# Patient Record
Sex: Female | Born: 1965 | Race: Black or African American | Hispanic: No | Marital: Married | Smoking: Never smoker
Health system: Southern US, Community
[De-identification: ages and names within clinical notes are randomized; demographics above are authoritative.]

## PROBLEM LIST (undated history)

## (undated) DIAGNOSIS — G43909 Migraine, unspecified, not intractable, without status migrainosus: Secondary | ICD-10-CM

## (undated) DIAGNOSIS — R569 Unspecified convulsions: Secondary | ICD-10-CM

## (undated) DIAGNOSIS — J45909 Unspecified asthma, uncomplicated: Secondary | ICD-10-CM

## (undated) DIAGNOSIS — I10 Essential (primary) hypertension: Secondary | ICD-10-CM

## (undated) DIAGNOSIS — L509 Urticaria, unspecified: Secondary | ICD-10-CM

## (undated) DIAGNOSIS — I209 Angina pectoris, unspecified: Secondary | ICD-10-CM

## (undated) HISTORY — PX: HERNIA REPAIR: SHX51

## (undated) HISTORY — DX: Urticaria, unspecified: L50.9

## (undated) HISTORY — PX: TUBAL LIGATION: SHX77

## (undated) HISTORY — DX: Unspecified asthma, uncomplicated: J45.909

---

## 2008-09-30 ENCOUNTER — Emergency Department (HOSPITAL_COMMUNITY): Admission: EM | Admit: 2008-09-30 | Discharge: 2008-09-30 | Payer: Self-pay | Admitting: Emergency Medicine

## 2008-10-19 ENCOUNTER — Ambulatory Visit: Payer: Self-pay | Admitting: Cardiology

## 2008-10-19 ENCOUNTER — Observation Stay (HOSPITAL_COMMUNITY): Admission: EM | Admit: 2008-10-19 | Discharge: 2008-10-20 | Payer: Self-pay | Admitting: Emergency Medicine

## 2008-10-20 ENCOUNTER — Encounter (INDEPENDENT_AMBULATORY_CARE_PROVIDER_SITE_OTHER): Payer: Self-pay | Admitting: Emergency Medicine

## 2010-05-12 LAB — URINALYSIS, ROUTINE W REFLEX MICROSCOPIC
Nitrite: NEGATIVE
Protein, ur: NEGATIVE mg/dL
Specific Gravity, Urine: 1.016 (ref 1.005–1.030)
Urobilinogen, UA: 0.2 mg/dL (ref 0.0–1.0)

## 2010-05-12 LAB — CBC
Hemoglobin: 11.7 g/dL — ABNORMAL LOW (ref 12.0–15.0)
MCHC: 32.9 g/dL (ref 30.0–36.0)
Platelets: 285 10*3/uL (ref 150–400)
RDW: 15.2 % (ref 11.5–15.5)

## 2010-05-12 LAB — PROTIME-INR: INR: 1 (ref 0.00–1.49)

## 2010-05-12 LAB — BASIC METABOLIC PANEL
BUN: 10 mg/dL (ref 6–23)
CO2: 28 mEq/L (ref 19–32)
Calcium: 9 mg/dL (ref 8.4–10.5)
GFR calc non Af Amer: >60 mL/min (ref >60)
Glucose, Bld: 97 mg/dL (ref 70–99)

## 2010-05-12 LAB — URINE MICROSCOPIC-ADD ON

## 2010-05-12 LAB — DIFFERENTIAL
Basophils Absolute: 0 10*3/uL (ref 0.0–0.1)
Basophils Relative: 0 % (ref 0–1)
Eosinophils Relative: 3 % (ref 0–5)
Lymphocytes Relative: 25 % (ref 12–46)
Monocytes Absolute: 0.9 10*3/uL (ref 0.1–1.0)
Neutro Abs: 7.6 10*3/uL (ref 1.7–7.7)

## 2010-05-12 LAB — POCT I-STAT, CHEM 8
HCT: 37 % (ref 36.0–46.0)
Hemoglobin: 12.6 g/dL (ref 12.0–15.0)
Potassium: 3.3 mEq/L — ABNORMAL LOW (ref 3.5–5.1)
Sodium: 136 mEq/L (ref 135–145)
TCO2: 24 mmol/L (ref 0–100)

## 2010-05-12 LAB — D-DIMER, QUANTITATIVE: D-Dimer, Quant: 0.24 ug/mL-FEU (ref 0.00–0.48)

## 2010-05-12 LAB — POCT CARDIAC MARKERS
CKMB, poc: 1 ng/mL (ref 1.0–8.0)
CKMB, poc: <1 ng/mL — ABNORMAL LOW (ref 1.0–8.0)
CKMB, poc: <1 ng/mL — ABNORMAL LOW (ref 1.0–8.0)
Myoglobin, poc: 56.8 ng/mL (ref 12–200)
Myoglobin, poc: 61.3 ng/mL (ref 12–200)
Myoglobin, poc: 74.2 ng/mL (ref 12–200)
Troponin i, poc: <0.05 ng/mL (ref 0.00–0.09)

## 2010-05-12 LAB — APTT: aPTT: 30 seconds (ref 24–37)

## 2010-05-13 LAB — CBC
MCV: 86.3 fL (ref 78.0–100.0)
Platelets: 316 10*3/uL (ref 150–400)
RBC: 4.29 MIL/uL (ref 3.87–5.11)
WBC: 11.5 10*3/uL — ABNORMAL HIGH (ref 4.0–10.5)

## 2010-05-13 LAB — COMPREHENSIVE METABOLIC PANEL
ALT: 15 U/L (ref 0–35)
AST: 20 U/L (ref 0–37)
Albumin: 3.6 g/dL (ref 3.5–5.2)
Chloride: 102 mEq/L (ref 96–112)
Creatinine, Ser: 0.76 mg/dL (ref 0.4–1.2)
GFR calc Af Amer: >60 mL/min (ref >60)
Potassium: 3.7 mEq/L (ref 3.5–5.1)
Sodium: 138 mEq/L (ref 135–145)
Total Bilirubin: 0.3 mg/dL (ref 0.3–1.2)

## 2010-05-13 LAB — POCT CARDIAC MARKERS
CKMB, poc: <1 ng/mL — ABNORMAL LOW (ref 1.0–8.0)
CKMB, poc: <1 ng/mL — ABNORMAL LOW (ref 1.0–8.0)
Myoglobin, poc: 43 ng/mL (ref 12–200)
Troponin i, poc: <0.05 ng/mL (ref 0.00–0.09)

## 2010-05-13 LAB — DIFFERENTIAL
Basophils Absolute: 0 10*3/uL (ref 0.0–0.1)
Eosinophils Absolute: 0.1 10*3/uL (ref 0.0–0.7)
Eosinophils Relative: 1 % (ref 0–5)
Lymphocytes Relative: 18 % (ref 12–46)
Monocytes Absolute: 0.2 10*3/uL (ref 0.1–1.0)

## 2013-09-19 ENCOUNTER — Emergency Department (HOSPITAL_COMMUNITY): Payer: Self-pay

## 2013-09-19 ENCOUNTER — Emergency Department (HOSPITAL_COMMUNITY)
Admission: EM | Admit: 2013-09-19 | Discharge: 2013-09-19 | Disposition: A | Discharge status: Home / Self Care | Payer: Self-pay | Attending: Emergency Medicine | Admitting: Emergency Medicine

## 2013-09-19 ENCOUNTER — Encounter (HOSPITAL_COMMUNITY): Payer: Self-pay | Admitting: Emergency Medicine

## 2013-09-19 DIAGNOSIS — G43009 Migraine without aura, not intractable, without status migrainosus: Secondary | ICD-10-CM

## 2013-09-19 DIAGNOSIS — G43109 Migraine with aura, not intractable, without status migrainosus: Secondary | ICD-10-CM | POA: Insufficient documentation

## 2013-09-19 DIAGNOSIS — I10 Essential (primary) hypertension: Secondary | ICD-10-CM | POA: Insufficient documentation

## 2013-09-19 DIAGNOSIS — Z79899 Other long term (current) drug therapy: Secondary | ICD-10-CM | POA: Insufficient documentation

## 2013-09-19 DIAGNOSIS — R55 Syncope and collapse: Secondary | ICD-10-CM | POA: Insufficient documentation

## 2013-09-19 HISTORY — DX: Essential (primary) hypertension: I10

## 2013-09-19 HISTORY — DX: Angina pectoris, unspecified: I20.9

## 2013-09-19 LAB — CBC WITH DIFFERENTIAL/PLATELET
Basophils Absolute: 0 10*3/uL (ref 0.0–0.1)
Basophils Relative: 0 % (ref 0–1)
Eosinophils Absolute: 0.2 10*3/uL (ref 0.0–0.7)
Eosinophils Relative: 1 % (ref 0–5)
HCT: 34 % — ABNORMAL LOW (ref 36.0–46.0)
Hemoglobin: 10.7 g/dL — ABNORMAL LOW (ref 12.0–15.0)
LYMPHS PCT: 25 % (ref 12–46)
Lymphs Abs: 3.2 10*3/uL (ref 0.7–4.0)
MCH: 25.2 pg — ABNORMAL LOW (ref 26.0–34.0)
MCHC: 31.5 g/dL (ref 30.0–36.0)
MCV: 80.2 fL (ref 78.0–100.0)
Monocytes Absolute: 1 10*3/uL (ref 0.1–1.0)
Monocytes Relative: 8 % (ref 3–12)
Neutro Abs: 8.3 10*3/uL — ABNORMAL HIGH (ref 1.7–7.7)
Neutrophils Relative %: 66 % (ref 43–77)
PLATELETS: 334 10*3/uL (ref 150–400)
RBC: 4.24 MIL/uL (ref 3.87–5.11)
RDW: 16.1 % — ABNORMAL HIGH (ref 11.5–15.5)
WBC: 12.7 10*3/uL — AB (ref 4.0–10.5)

## 2013-09-19 LAB — BASIC METABOLIC PANEL
Anion gap: 9 (ref 5–15)
BUN: 13 mg/dL (ref 6–23)
CO2: 27 mEq/L (ref 19–32)
Calcium: 9.1 mg/dL (ref 8.4–10.5)
Chloride: 99 mEq/L (ref 96–112)
Creatinine, Ser: 0.66 mg/dL (ref 0.50–1.10)
GFR calc Af Amer: >90 mL/min (ref >90)
GFR calc non Af Amer: >90 mL/min (ref >90)
Glucose, Bld: 84 mg/dL (ref 70–99)
Potassium: 3.6 mEq/L — ABNORMAL LOW (ref 3.7–5.3)
SODIUM: 135 meq/L — AB (ref 137–147)

## 2013-09-19 LAB — HCG, SERUM, QUALITATIVE: PREG SERUM: NEGATIVE

## 2013-09-19 MED ORDER — ACETAMINOPHEN 500 MG PO TABS
1000.0000 mg | ORAL_TABLET | Freq: Once | ORAL | Status: AC
  Administered 2013-09-19: 1000 mg via ORAL
  Filled 2013-09-19: qty 2

## 2013-09-19 MED ORDER — MORPHINE SULFATE 4 MG/ML IJ SOLN
4.0000 mg | INTRAMUSCULAR | Status: DC | PRN
  Filled 2013-09-19: qty 1

## 2013-09-19 MED ORDER — PROCHLORPERAZINE EDISYLATE 5 MG/ML IJ SOLN
10.0000 mg | Freq: Once | INTRAMUSCULAR | Status: DC
  Filled 2013-09-19: qty 2

## 2013-09-19 MED ORDER — SODIUM CHLORIDE 0.9 % IV BOLUS (SEPSIS)
1000.0000 mL | Freq: Once | INTRAVENOUS | Status: AC
  Administered 2013-09-19: 1000 mL via INTRAVENOUS

## 2013-09-19 NOTE — ED Notes (Signed)
Pt states she had a bad headache, felt very dizzy, the she "fell", thinks she may have passed out.  States she started a new medication for her high blood pressure today.  Lisinopril/hctz 20/12.5 and amlodapine 10 mg. She states she took both at the same time around 1pm.

## 2013-09-19 NOTE — Discharge Instructions (Signed)

## 2013-09-19 NOTE — ED Provider Notes (Signed)
CSN: [id number]     Arrival date & time 09/19/13  1926 History  This chart was scribed for Dorie Rank, MD by Jeanell Sparrow, ED Scribe. This patient was seen in room APA11/APA11 and the patient's care was started at 8:02 PM.   Chief Complaint  Patient presents with  . Near Syncope   The history is provided by the patient. No language interpreter was used.   HPI Comments: Kristina Bonilla is a 48 y.o. female with a hx of migraines who presents to the Emergency Department complaining of an episode of syncope that occurred today. Pt states that she had a headache and felt dizzy when she fell in her bathroom and lost consciousness. She states that she started a new blood pressure medication today that she took about 7 hours ago. She reports that her dizziness and HA has been present for about 4 days. She states that her headache is right sided and goes across the front and back of her head. She reports that her headache is similar to prior migraines. She states that her dizziness and headache were exacerbated when taking the new medications, which are Lisinopril and Amlodapine. She reports no prior hx of syncopal episodes. She denies any hx of cardiac stents, heart catheterization, or stress test. She also denies any difficulty with speech, nausea, emesis, or diarrhea.   Past Medical History  Diagnosis Date  . Hypertension   . Angina pectoris    History reviewed. No pertinent past surgical history. History reviewed. No pertinent family history. History  Substance Use Topics  . Smoking status: Never Smoker   . Smokeless tobacco: Never Used  . Alcohol Use: Not on file   OB History   Grav Para Term Preterm Abortions TAB SAB Ect Mult Living                 Review of Systems  Constitutional: Negative for fever.  Cardiovascular: Positive for near-syncope.  Gastrointestinal: Negative for nausea, vomiting, diarrhea and blood in stool.  Neurological: Positive for dizziness, syncope and headaches.  Negative for speech difficulty.  All other systems reviewed and are negative.   Allergies  Review of patient's allergies indicates no known allergies.  Home Medications   Prior to Admission medications   Medication Sig Start Date End Date Taking? Authorizing Provider  amLODipine (NORVASC) 10 MG tablet Take 10 mg by mouth daily.   Yes Historical Provider, MD  lisinopril-hydrochlorothiazide (PRINZIDE,ZESTORETIC) 20-12.5 MG per tablet Take 1 tablet by mouth daily.   Yes Historical Provider, MD   BP 126/75  Pulse 75  Temp(Src) 98.4 F (36.9 C) (Oral)  Resp 20  Ht 5\' 2"  (1.575 m)  Wt 229 lb (103.874 kg)  BMI 41.87 kg/m2  SpO2 100%  LMP 08/26/2013 Physical Exam  Nursing note and vitals reviewed. Constitutional: She appears well-developed and well-nourished. No distress.  HENT:  Head: Normocephalic and atraumatic.  Right Ear: External ear normal.  Left Ear: External ear normal.  Eyes: Conjunctivae are normal. Right eye exhibits no discharge. Left eye exhibits no discharge. No scleral icterus.  Neck: Neck supple. No tracheal deviation present.  Cardiovascular: Normal rate, regular rhythm and intact distal pulses.   Pulmonary/Chest: Effort normal and breath sounds normal. No stridor. No respiratory distress. She has no wheezes. She has no rales.  Abdominal: Soft. Bowel sounds are normal. She exhibits no distension. There is no tenderness. There is no rebound and no guarding.  Musculoskeletal: She exhibits no edema and no tenderness.  Neurological: She  is alert. She has normal strength. No cranial nerve deficit (no facial droop, extraocular movements intact, no slurred speech) or sensory deficit. She exhibits normal muscle tone. She displays no seizure activity. Coordination normal.  Skin: Skin is warm and dry. No rash noted.  Psychiatric: She has a normal mood and affect.    ED Course  Procedures (including critical care time) DIAGNOSTIC STUDIES: Oxygen Saturation is 100% on RA,  normal by my interpretation.    COORDINATION OF CARE: 8:06 PM- Pt advised of plan for treatment which includes medication, labs, and an EKG and pt agrees.  Labs Review Labs Reviewed  CBC WITH DIFFERENTIAL - Abnormal; Notable for the following:    WBC 12.7 (*)    Hemoglobin 10.7 (*)    HCT 34.0 (*)    MCH 25.2 (*)    RDW 16.1 (*)    Neutro Abs 8.3 (*)    All other components within normal limits  BASIC METABOLIC PANEL - Abnormal; Notable for the following:    Sodium 135 (*)    Potassium 3.6 (*)    All other components within normal limits  HCG, SERUM, QUALITATIVE    Imaging Review Ct Head Wo Contrast  09/19/2013   CLINICAL DATA:  Headache.  Hypertension.  EXAM: CT HEAD WITHOUT CONTRAST  TECHNIQUE: Contiguous axial images were obtained from the base of the skull through the vertex without intravenous contrast.  COMPARISON:  None.  FINDINGS: There is no evidence of acute intracranial abnormality including infarct, hemorrhage, mass lesion, mass effect, midline shift or abnormal extra-axial fluid collection. There is no hydrocephalus or pneumocephalus. The calvarium is intact. Imaged paranasal sinuses and mastoid air cells are clear.  IMPRESSION: Negative head CT.   Electronically Signed   By: Inge Rise M.D.   On: 09/19/2013 21:20     EKG Interpretation   Date/Time:  Saturday September 19 2013 19:55:02 EDT Ventricular Rate:  80 PR Interval:  161 QRS Duration: 88 QT Interval:  373 QTC Calculation: 430 R Axis:   46 Text Interpretation:  Sinus rhythm Normal ECG Confirmed by POLLINA  MD,  CHRISTOPHER [phone]) on 09/19/2013 8:48:14 PM      MDM   Final diagnoses:  Vasovagal syncope  Migraine without aura and without status migrainosus, not intractable   The patient's headache suspect is related to a migraine headache. She does have a history of this. Did not want any medications in the emergency department other than Tylenol. The symptoms have subsequently resolved. I doubt  more serious etiology such as hemorrhage, meningitis, stroke.  The patient's fainting spell maybe vasovagal in nature. Her EKG is unremarkable. I doubt cardiac dysrhythmia. Patient has a mild anemia but this not significantly changed from her baseline. She does have history of heavy menstrual periods.   I personally performed the services described in this documentation, which was scribed in my presence.  The recorded information has been reviewed and is accurate.     Dorie Rank, MD 09/19/13 [phone]

## 2014-03-09 ENCOUNTER — Telehealth: Payer: Self-pay | Admitting: *Deleted

## 2014-03-09 NOTE — Telephone Encounter (Deleted)
error 

## 2014-03-09 NOTE — Telephone Encounter (Signed)
error 

## 2014-03-24 ENCOUNTER — Emergency Department (HOSPITAL_COMMUNITY)
Admission: EM | Admit: 2014-03-24 | Discharge: 2014-03-25 | Disposition: A | Discharge status: Home / Self Care | Payer: BLUE CROSS/BLUE SHIELD | Attending: Emergency Medicine | Admitting: Emergency Medicine

## 2014-03-24 ENCOUNTER — Encounter (HOSPITAL_COMMUNITY): Payer: Self-pay | Admitting: Emergency Medicine

## 2014-03-24 DIAGNOSIS — R519 Headache, unspecified: Secondary | ICD-10-CM

## 2014-03-24 DIAGNOSIS — Z79899 Other long term (current) drug therapy: Secondary | ICD-10-CM | POA: Diagnosis not present

## 2014-03-24 DIAGNOSIS — I209 Angina pectoris, unspecified: Secondary | ICD-10-CM | POA: Diagnosis not present

## 2014-03-24 DIAGNOSIS — R51 Headache: Secondary | ICD-10-CM | POA: Insufficient documentation

## 2014-03-24 DIAGNOSIS — I10 Essential (primary) hypertension: Secondary | ICD-10-CM | POA: Insufficient documentation

## 2014-03-24 LAB — CBC WITH DIFFERENTIAL/PLATELET
BASOS ABS: 0 10*3/uL (ref 0.0–0.1)
BASOS PCT: 0 % (ref 0–1)
EOS PCT: 1 % (ref 0–5)
Eosinophils Absolute: 0.2 10*3/uL (ref 0.0–0.7)
HCT: 32.7 % — ABNORMAL LOW (ref 36.0–46.0)
Hemoglobin: 10.2 g/dL — ABNORMAL LOW (ref 12.0–15.0)
Lymphocytes Relative: 22 % (ref 12–46)
Lymphs Abs: 3.2 10*3/uL (ref 0.7–4.0)
MCH: 25.4 pg — ABNORMAL LOW (ref 26.0–34.0)
MCHC: 31.2 g/dL (ref 30.0–36.0)
MCV: 81.5 fL (ref 78.0–100.0)
MONO ABS: 1.3 10*3/uL — AB (ref 0.1–1.0)
Monocytes Relative: 9 % (ref 3–12)
NEUTROS ABS: 10.1 10*3/uL — AB (ref 1.7–7.7)
Neutrophils Relative %: 68 % (ref 43–77)
PLATELETS: 333 10*3/uL (ref 150–400)
RBC: 4.01 MIL/uL (ref 3.87–5.11)
RDW: 15.6 % — AB (ref 11.5–15.5)
WBC: 14.7 10*3/uL — AB (ref 4.0–10.5)

## 2014-03-24 LAB — COMPREHENSIVE METABOLIC PANEL
ALBUMIN: 3.4 g/dL — AB (ref 3.5–5.2)
ALK PHOS: 98 U/L (ref 39–117)
ALT: 17 U/L (ref 0–35)
AST: 18 U/L (ref 0–37)
Anion gap: 3 — ABNORMAL LOW (ref 5–15)
BUN: 13 mg/dL (ref 6–23)
CO2: 27 mmol/L (ref 19–32)
Calcium: 8.6 mg/dL (ref 8.4–10.5)
Chloride: 107 mmol/L (ref 96–112)
Creatinine, Ser: 0.68 mg/dL (ref 0.50–1.10)
GFR calc Af Amer: >90 mL/min (ref >90)
GFR calc non Af Amer: >90 mL/min (ref >90)
Glucose, Bld: 107 mg/dL — ABNORMAL HIGH (ref 70–99)
POTASSIUM: 3.6 mmol/L (ref 3.5–5.1)
SODIUM: 137 mmol/L (ref 135–145)
Total Bilirubin: 0.2 mg/dL — ABNORMAL LOW (ref 0.3–1.2)
Total Protein: 7.5 g/dL (ref 6.0–8.3)

## 2014-03-24 NOTE — ED Notes (Signed)
Pt reports her BP has been elevated and she wakes up with a headache.

## 2014-03-25 MED ORDER — LORAZEPAM 2 MG/ML IJ SOLN
0.5000 mg | Freq: Once | INTRAMUSCULAR | Status: AC
  Administered 2014-03-25: 0.5 mg via INTRAMUSCULAR
  Filled 2014-03-25: qty 1

## 2014-03-25 MED ORDER — SODIUM CHLORIDE 0.9 % IV BOLUS (SEPSIS)
1000.0000 mL | Freq: Once | INTRAVENOUS | Status: AC
  Administered 2014-03-25: 1000 mL via INTRAVENOUS

## 2014-03-25 MED ORDER — METOCLOPRAMIDE HCL 5 MG/ML IJ SOLN
10.0000 mg | Freq: Once | INTRAMUSCULAR | Status: AC
  Administered 2014-03-25: 10 mg via INTRAVENOUS
  Filled 2014-03-25: qty 2

## 2014-03-25 MED ORDER — KETOROLAC TROMETHAMINE 30 MG/ML IJ SOLN
30.0000 mg | Freq: Once | INTRAMUSCULAR | Status: AC
  Administered 2014-03-25: 30 mg via INTRAVENOUS
  Filled 2014-03-25: qty 1

## 2014-03-25 MED ORDER — DIPHENHYDRAMINE HCL 50 MG/ML IJ SOLN
25.0000 mg | Freq: Once | INTRAMUSCULAR | Status: AC
  Administered 2014-03-25: 25 mg via INTRAVENOUS
  Filled 2014-03-25: qty 1

## 2014-03-25 NOTE — ED Provider Notes (Signed)
CSN: [id number]     Arrival date & time 03/24/14  2221 History   First MD Initiated Contact with Patient 03/24/14 2347     Chief Complaint  Patient presents with  . Hypertension  . Headache     (Consider location/radiation/quality/duration/timing/severity/associated sxs/prior Treatment) HPI  Patient reports for the past 4 days she has had a headache that she indicates is behind her ears bilaterally. She describes it as a thumping and nagging pain. She takes Excedrin and it will go away for several hours however it returns. She's taking Excedrin about 6 times a day. She denies any ringing of her ears. She has had nausea without vomiting, she states she has photosensitivity and noise sensitivity. She states she also noticed the headache gets worse when she doesn't get enough rest. She states she's had this headache before. She states she gets headaches 3-4 times a month. She even had her glasses changed however the headaches have continued. She states there is a strong family history of headaches. She states she has been a unable to sleep for the past 4 days because of her headache hurting. 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 She checked her blood pressure and it was 151/117 with a heart rate of 102. She states her current pain is 8 out of 10 which is the worse it has been.  PCP [facility] Department   Past Medical History  Diagnosis Date  . Hypertension   . Angina pectoris    History reviewed. No pertinent past surgical history. No family history on file. History  Substance Use Topics  . Smoking status: Never Smoker   . Smokeless tobacco: Never Used  . Alcohol Use: No   employed   OB History    No data available     Review of Systems  All other systems reviewed and are negative.     Allergies  Review of patient's allergies indicates no known allergies.  Home Medications   Prior to Admission medications   Medication Sig Start Date End Date  Taking? Authorizing Provider  amLODipine (NORVASC) 10 MG tablet Take 10 mg by mouth daily.    Historical Provider, MD  lisinopril-hydrochlorothiazide (PRINZIDE,ZESTORETIC) 20-12.5 MG per tablet Take 1 tablet by mouth daily. Not taking   Historical Provider, MD   Lasix for swelling   ED Triage Vitals  Enc Vitals Group     BP 03/24/14 2224 141/81 mmHg     Pulse Rate 03/24/14 2224 102     Resp 03/24/14 2224 20     Temp 03/24/14 2224 97.7 F (36.5 C)     Temp Source 03/24/14 2224 Oral     SpO2 03/24/14 2224 98 %     Weight 03/24/14 2224 248 lb (112.492 kg)     Height 03/24/14 2224 5\' 1"  (1.549 m)     Head Cir --      Peak Flow --      Pain Score 03/24/14 2225 9     Pain Loc --      Pain Edu? --      Excl. in [place]? --    Vital signs normal except for tachycardia   Physical Exam  Constitutional: She is oriented to person, place, and time. She appears well-developed and well-nourished.  Non-toxic appearance. She does not appear ill. No distress.  HENT:  Head: Normocephalic and atraumatic.    Right Ear: External ear normal.  Left Ear: External ear normal.  Nose: Nose normal.  No mucosal edema or rhinorrhea.  Mouth/Throat: Oropharynx is clear and moist and mucous membranes are normal. No dental abscesses or uvula swelling.  Area of headache noted, no swelling over the mastoid area, no redness  Eyes: Conjunctivae and EOM are normal. Pupils are equal, round, and reactive to light.  Neck: Normal range of motion and full passive range of motion without pain. Neck supple.  Cardiovascular: Normal rate, regular rhythm and normal heart sounds.  Exam reveals no gallop and no friction rub.   No murmur heard. Pulmonary/Chest: Effort normal and breath sounds normal. No respiratory distress. She has no wheezes. She has no rhonchi. She has no rales. She exhibits no tenderness and no crepitus.  Abdominal: Soft. Normal appearance and bowel sounds are normal. She exhibits no distension. There is no  tenderness. There is no rebound and no guarding.  Musculoskeletal: Normal range of motion. She exhibits no edema or tenderness.  Moves all extremities well.   Neurological: She is alert and oriented to person, place, and time. She has normal strength. No cranial nerve deficit.  Skin: Skin is warm, dry and intact. No rash noted. No erythema. No pallor.  Psychiatric: She has a normal mood and affect. Her speech is normal and behavior is normal. Her mood appears not anxious.  Nursing note and vitals reviewed.   ED Course  Procedures (including critical care time)  Medications  sodium chloride 0.9 % bolus 1,000 mL (0 mLs Intravenous Stopped 03/25/14 0124)  metoCLOPramide (REGLAN) injection 10 mg (10 mg Intravenous Given 03/25/14 0106)  diphenhydrAMINE (BENADRYL) injection 25 mg (25 mg Intravenous Given 03/25/14 0105)  ketorolac (TORADOL) 30 MG/ML injection 30 mg (30 mg Intravenous Given 03/25/14 0106)  LORazepam (ATIVAN) injection 0.5 mg (0.5 mg Intramuscular Given 03/25/14 0142)   Nurse reports patient took out her IV was complained of feeling jittery. She was given Ativan 1/2 mg IM. At time of discharge she states her headache is gone.   Labs Review Results for orders placed or performed during the hospital encounter of 03/24/14  CBC with Differential  Result Value Ref Range   WBC 14.7 (H) 4.0 - 10.5 K/uL   RBC 4.01 3.87 - 5.11 MIL/uL   Hemoglobin 10.2 (L) 12.0 - 15.0 g/dL   HCT 32.7 (L) 36.0 - 46.0 %   MCV 81.5 78.0 - 100.0 fL   MCH 25.4 (L) 26.0 - 34.0 pg   MCHC 31.2 30.0 - 36.0 g/dL   RDW 15.6 (H) 11.5 - 15.5 %   Platelets 333 150 - 400 K/uL   Neutrophils Relative % 68 43 - 77 %   Neutro Abs 10.1 (H) 1.7 - 7.7 K/uL   Lymphocytes Relative 22 12 - 46 %   Lymphs Abs 3.2 0.7 - 4.0 K/uL   Monocytes Relative 9 3 - 12 %   Monocytes Absolute 1.3 (H) 0.1 - 1.0 K/uL   Eosinophils Relative 1 0 - 5 %   Eosinophils Absolute 0.2 0.0 - 0.7 K/uL   Basophils Relative 0 0 - 1 %   Basophils  Absolute 0.0 0.0 - 0.1 K/uL  Comprehensive metabolic panel  Result Value Ref Range   Sodium 137 135 - 145 mmol/L   Potassium 3.6 3.5 - 5.1 mmol/L   Chloride 107 96 - 112 mmol/L   CO2 27 19 - 32 mmol/L   Glucose, Bld 107 (H) 70 - 99 mg/dL   BUN 13 6 - 23 mg/dL   Creatinine, Ser 0.68 0.50 - 1.10 mg/dL  Calcium 8.6 8.4 - 10.5 mg/dL   Total Protein 7.5 6.0 - 8.3 g/dL   Albumin 3.4 (L) 3.5 - 5.2 g/dL   AST 18 0 - 37 U/L   ALT 17 0 - 35 U/L   Alkaline Phosphatase 98 39 - 117 U/L   Total Bilirubin 0.2 (L) 0.3 - 1.2 mg/dL   GFR calc non Af Amer >90 >90 mL/min   GFR calc Af Amer >90 >90 mL/min   Anion gap 3 (L) 5 - 15   Laboratory interpretation all normal except leukocytosis and anemia    Imaging Review No results found.   EKG Interpretation None      MDM   Final diagnoses:  Headache, unspecified headache type    Plan discharge  Rolland Porter, MD, Alanson Aly, MD 03/25/14 0230

## 2014-03-25 NOTE — Discharge Instructions (Signed)
Recheck as needed. Consider seeing a neurologist about your headaches.    General Headache Without Cause A headache is pain or discomfort felt around the head or neck area. The specific cause of a headache may not be found. There are many causes and types of headaches. A few common ones are:  Tension headaches.  Migraine headaches.  Cluster headaches.  Chronic daily headaches. HOME CARE INSTRUCTIONS   Keep all follow-up appointments with your caregiver or any specialist referral.  Only take over-the-counter or prescription medicines for pain or discomfort as directed by your caregiver.  Lie down in a dark, quiet room when you have a headache.  Keep a headache journal to find out what may trigger your migraine headaches. For example, write down:  What you eat and drink.  How much sleep you get.  Any change to your diet or medicines.  Try massage or other relaxation techniques.  Put ice packs or heat on the head and neck. Use these 3 to 4 times per day for 15 to 20 minutes each time, or as needed.  Limit stress.  Sit up straight, and do not tense your muscles.  Quit smoking if you smoke.  Limit alcohol use.  Decrease the amount of caffeine you drink, or stop drinking caffeine.  Eat and sleep on a regular schedule.  Get 7 to 9 hours of sleep, or as recommended by your caregiver.  Keep lights dim if bright lights bother you and make your headaches worse. SEEK MEDICAL CARE IF:   You have problems with the medicines you were prescribed.  Your medicines are not working.  You have a change from the usual headache.  You have nausea or vomiting. SEEK IMMEDIATE MEDICAL CARE IF:   Your headache becomes severe.  You have a fever.  You have a stiff neck.  You have loss of vision.  You have muscular weakness or loss of muscle control.  You start losing your balance or have trouble walking.  You feel faint or pass out.  You have severe symptoms that are  different from your first symptoms. MAKE SURE YOU:   Understand these instructions.  Will watch your condition.  Will get help right away if you are not doing well or get worse. Document Released: 01/22/2005 Document Revised: 04/16/2011 Document Reviewed: 02/07/2011 [facility] Patient Information 2015 [place], [place]. This information is not intended to replace advice given to you by your health care provider. Make sure you discuss any questions you have with your health care provider.

## 2014-03-25 NOTE — ED Notes (Signed)
Pt removed IV stating "I don't like the way it made me feel. I feel jittery and I can't sit still."

## 2014-11-23 ENCOUNTER — Other Ambulatory Visit (HOSPITAL_COMMUNITY): Payer: Self-pay | Admitting: *Deleted

## 2014-11-23 DIAGNOSIS — N644 Mastodynia: Secondary | ICD-10-CM

## 2014-11-23 DIAGNOSIS — N63 Unspecified lump in unspecified breast: Secondary | ICD-10-CM

## 2014-12-01 ENCOUNTER — Encounter: Payer: Self-pay | Admitting: Obstetrics & Gynecology

## 2014-12-01 ENCOUNTER — Ambulatory Visit (INDEPENDENT_AMBULATORY_CARE_PROVIDER_SITE_OTHER): Payer: 59 | Admitting: Obstetrics & Gynecology

## 2014-12-01 ENCOUNTER — Other Ambulatory Visit (HOSPITAL_COMMUNITY)
Admission: RE | Admit: 2014-12-01 | Discharge: 2014-12-01 | Disposition: A | Discharge status: Home / Self Care | Payer: 59 | Source: Ambulatory Visit | Attending: Obstetrics & Gynecology | Admitting: Obstetrics & Gynecology

## 2014-12-01 VITALS — BP 110/70 | HR 74 | Ht 62.0 in | Wt 244.0 lb

## 2014-12-01 DIAGNOSIS — R1032 Left lower quadrant pain: Secondary | ICD-10-CM

## 2014-12-01 DIAGNOSIS — Z1151 Encounter for screening for human papillomavirus (HPV): Secondary | ICD-10-CM | POA: Diagnosis not present

## 2014-12-01 DIAGNOSIS — Z01419 Encounter for gynecological examination (general) (routine) without abnormal findings: Secondary | ICD-10-CM

## 2014-12-01 NOTE — Progress Notes (Signed)
Patient ID: Kristina Bonilla, female   DOB: 12-29-1965, 49 y.o.   MRN: [id number] Subjective:     Kristina Bonilla is a 49 y.o. female here for a routine exam.  Patient's last menstrual period was 10/28/2014. No obstetric history on file. 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   Current acute medical issues:  hypertension   Recent Gynecologic History Patient's last menstrual period was 10/28/2014. Last Pap: years ago, 2005,  normal Last mammogram: 2005,  Normal, has one scheduled for 12/07/2014  Past Medical History  Diagnosis Date  . Hypertension   . Angina pectoris [facility])     Past Surgical History  Procedure Laterality Date  . Tubal ligation      OB History    No data available      Social History   Social History  . Marital Status: Married    Spouse Name: N/A  . Number of Children: N/A  . Years of Education: N/A   Social History Main Topics  . Smoking status: Never Smoker   . Smokeless tobacco: Never Used  . Alcohol Use: No  . Drug Use: None  . Sexual Activity: Not Asked   Other Topics Concern  . None   Social History Narrative    Family History  Problem Relation Age of Onset  . Heart disease Mother   . Hypertension Father   . Stroke Father   . Cancer Paternal Aunt   . Cancer Paternal Aunt      Current outpatient prescriptions:  .  amLODipine (NORVASC) 10 MG tablet, Take 10 mg by mouth daily., Disp: , Rfl:  .  metoprolol succinate (TOPROL-XL) 25 MG 24 hr tablet, Take 25 mg by mouth daily., Disp: , Rfl:  .  lisinopril-hydrochlorothiazide (PRINZIDE,ZESTORETIC) 20-12.5 MG per tablet, Take 1 tablet by mouth daily., Disp: , Rfl:   Review of Systems  Review of Systems  Constitutional: Negative for fever, chills, weight loss, malaise/fatigue and diaphoresis.   HENT: Negative for hearing loss, ear pain, nosebleeds, congestion, sore throat, neck pain, tinnitus and ear discharge.   Eyes: Negative for blurred vision, double vision, photophobia, pain, discharge and redness.  Respiratory: Negative for cough, hemoptysis, sputum production, shortness of breath, wheezing and stridor.   Cardiovascular: Negative for chest pain, palpitations, orthopnea, claudication, leg swelling and PND.  Gastrointestinal: negative for abdominal pain. Negative for heartburn, nausea, vomiting, diarrhea, constipation, blood in stool and melena.  Genitourinary: Negative for dysuria, urgency, frequency, hematuria and flank pain.  Musculoskeletal: Negative for myalgias, back pain, joint pain and falls.  Skin: Negative for itching and rash.  Neurological: Negative for dizziness, tingling, tremors, sensory change, speech change, focal weakness, seizures, loss of consciousness, weakness and headaches.  Endo/Heme/Allergies: Negative for environmental allergies and polydipsia. Does not bruise/bleed easily.  Psychiatric/Behavioral: Negative for depression, suicidal ideas, hallucinations, memory loss and substance abuse. The patient is not nervous/anxious and does not have insomnia.        Objective:  Blood pressure 110/70, pulse 74, height 5\' 2"  (1.575 m), weight 244 lb (110.678 kg), last menstrual period 10/28/2014.   Physical Exam  Vitals reviewed. Constitutional: She is oriented to person, place, and time. She appears well-developed and well-nourished.  HENT:  Head: Normocephalic and atraumatic.        Right Ear: External ear normal.  Left Ear:  External ear normal.  Nose: Nose normal.  Mouth/Throat: Oropharynx is clear and moist.  Eyes: Conjunctivae and EOM are normal. Pupils are equal, round, and reactive to light. Right eye exhibits no discharge. Left eye exhibits no discharge. No scleral icterus.  Neck: Normal range of motion. Neck supple. No tracheal deviation present. No  thyromegaly present.  Cardiovascular: Normal rate, regular rhythm, normal heart sounds and intact distal pulses.  Exam reveals no gallop and no friction rub.   No murmur heard. Respiratory: Effort normal and breath sounds normal. No respiratory distress. She has no wheezes. She has no rales. She exhibits no tenderness.  GI: Soft. Bowel sounds are normal. She exhibits no distension and no mass. There is no tenderness except in the left lower quadrant not froin.  +Cornet's sign  There is no rebound and no guarding.  Genitourinary:  Breasts no masses skin changes or nipple changes bilaterally      Vulva is normal without lesions Vagina is pink moist without discharge Cervix normal in appearance and pap is done Uterus is normal size shape and contour Adnexa is negative with normal sized ovaries   Musculoskeletal: Normal range of motion. She exhibits no edema and no tenderness.  Neurological: She is alert and oriented to person, place, and time. She has normal reflexes. She displays normal reflexes. No cranial nerve deficit. She exhibits normal muscle tone. Coordination normal.  Skin: Skin is warm and dry. No rash noted. No erythema. No pallor.  Psychiatric: She has a normal mood and affect. Her behavior is normal. Judgment and thought content normal.       Assessment:    Healthy female exam.   Musculoskeletal left lower abdominal wall pain   Plan:    Mammogram ordered. Follow up in: 2 weeks.     Trigger Point Injection   Pre-operative diagnosis: myofascial pain  Post-operative diagnosis: myofascial pain  After risks and benefits were explained including bleeding, infection, worsening of the pain, damage to the area being injected, weakness, allergic reaction to medications, vascular injection, and nerve damage, signed consent was obtained.  All questions were answered.    The area of the trigger point was identified and the skin prepped three times with alcohol and the alcohol  allowed to dry.  Next, a 25 gauge 1.5 inch needle was placed in the area of the trigger point.  Once reproduction of the pain was elicited and negative aspiration confirmed, the trigger point was injected and the needle removed.    The patient did tolerate the procedure well and there were not complications.    Medication used: 10 cc of 0.5% marcine  Trigger points injected: 1    Trigger point(s) location(s):  left

## 2014-12-06 LAB — CYTOLOGY - PAP

## 2014-12-07 ENCOUNTER — Ambulatory Visit (HOSPITAL_COMMUNITY)
Admission: RE | Admit: 2014-12-07 | Discharge: 2014-12-07 | Disposition: A | Discharge status: Home / Self Care | Payer: 59 | Source: Ambulatory Visit | Attending: *Deleted | Admitting: *Deleted

## 2014-12-07 ENCOUNTER — Other Ambulatory Visit (HOSPITAL_COMMUNITY): Payer: Self-pay | Admitting: *Deleted

## 2014-12-07 DIAGNOSIS — N6320 Unspecified lump in the left breast, unspecified quadrant: Secondary | ICD-10-CM

## 2014-12-07 DIAGNOSIS — N63 Unspecified lump in unspecified breast: Secondary | ICD-10-CM

## 2014-12-07 DIAGNOSIS — N6310 Unspecified lump in the right breast, unspecified quadrant: Secondary | ICD-10-CM

## 2014-12-07 DIAGNOSIS — N644 Mastodynia: Secondary | ICD-10-CM | POA: Diagnosis not present

## 2014-12-07 DIAGNOSIS — R590 Localized enlarged lymph nodes: Secondary | ICD-10-CM | POA: Insufficient documentation

## 2014-12-16 ENCOUNTER — Encounter: Payer: Self-pay | Admitting: Obstetrics & Gynecology

## 2014-12-16 ENCOUNTER — Ambulatory Visit: Payer: 59 | Admitting: Obstetrics & Gynecology

## 2015-03-11 ENCOUNTER — Other Ambulatory Visit (HOSPITAL_COMMUNITY)
Admission: RE | Admit: 2015-03-11 | Discharge: 2015-03-11 | Disposition: A | Discharge status: Home / Self Care | Payer: 59 | Source: Ambulatory Visit | Attending: Emergency Medicine | Admitting: Emergency Medicine

## 2015-03-11 ENCOUNTER — Encounter (HOSPITAL_COMMUNITY): Payer: Self-pay | Admitting: *Deleted

## 2015-03-11 ENCOUNTER — Emergency Department (INDEPENDENT_AMBULATORY_CARE_PROVIDER_SITE_OTHER)
Admission: EM | Admit: 2015-03-11 | Discharge: 2015-03-11 | Disposition: A | Discharge status: Home / Self Care | Payer: 59 | Source: Home / Self Care

## 2015-03-11 DIAGNOSIS — N390 Urinary tract infection, site not specified: Secondary | ICD-10-CM | POA: Diagnosis not present

## 2015-03-11 LAB — POCT URINALYSIS DIP (DEVICE)
BILIRUBIN URINE: NEGATIVE
Glucose, UA: NEGATIVE mg/dL
KETONES UR: NEGATIVE mg/dL
Leukocytes, UA: NEGATIVE
Nitrite: NEGATIVE
Protein, ur: NEGATIVE mg/dL
Urobilinogen, UA: 0.2 mg/dL (ref 0.0–1.0)
pH: 5.5 (ref 5.0–8.0)

## 2015-03-11 LAB — POCT PREGNANCY, URINE: Preg Test, Ur: NEGATIVE

## 2015-03-11 MED ORDER — CEPHALEXIN 500 MG PO CAPS: 500.0000 mg | ORAL_CAPSULE | Freq: Four times a day (QID) | ORAL | Status: DC

## 2015-03-11 NOTE — Discharge Instructions (Signed)
Take all of medicine as directed, drink lots of fluids, see your doctor if further problems. °

## 2015-03-11 NOTE — ED Notes (Signed)
Pt  Reports  Symptoms  Of     Dysuria   With  Pain   On  Urination        Symptoms    Not  releived  By  [facility] meds       Pt  Reports   Symptoms      Pt    Reports       Not   Getting  Better     over  Last  Few  Days

## 2015-03-11 NOTE — ED Provider Notes (Addendum)
CSN: [id number]     Arrival date & time 03/11/15  1901 History   None    Chief Complaint  Patient presents with  . Urinary Tract Infection   (Consider location/radiation/quality/duration/timing/severity/associated sxs/prior Treatment) Patient is a 50 y.o. female presenting with urinary tract infection. The history is provided by the patient.  Urinary Tract Infection Pain quality:  Burning Pain severity:  Mild Onset quality:  Sudden Duration:  2 days Progression:  Worsening Chronicity:  New Recent urinary tract infections: no   Relieved by:  None tried Worsened by:  Nothing tried Ineffective treatments:  None tried Urinary symptoms: foul-smelling urine and frequent urination   Associated symptoms: no abdominal pain, no fever, no flank pain, no nausea, no vaginal discharge and no vomiting   Risk factors: no recurrent urinary tract infections     Past Medical History  Diagnosis Date  . Hypertension   . Angina pectoris [facility])    Past Surgical History  Procedure Laterality Date  . Tubal ligation     Family History  Problem Relation Age of Onset  . Heart disease Mother   . Hypertension Father   . Stroke Father   . Cancer Paternal Aunt   . Cancer Paternal Aunt    Social History  Substance Use Topics  . Smoking status: Never Smoker   . Smokeless tobacco: Never Used  . Alcohol Use: No   OB History    No data available     Review of Systems  Constitutional: Negative.  Negative for fever.  Gastrointestinal: Negative.  Negative for nausea, vomiting and abdominal pain.  Genitourinary: Positive for dysuria, urgency and frequency. Negative for flank pain, vaginal bleeding, vaginal discharge, vaginal pain, menstrual problem and pelvic pain.  Musculoskeletal: Negative.   All other systems reviewed and are negative.   Allergies  Review of patient's allergies indicates no known allergies.  Home Medications   Prior to Admission medications   Medication Sig Start Date End  Date Taking? Authorizing Provider  amLODipine (NORVASC) 10 MG tablet Take 10 mg by mouth daily.    Historical Provider, MD  cephALEXin (KEFLEX) 500 MG capsule Take 1 capsule (500 mg total) by mouth 4 (four) times daily. Take all of medicine and drink lots of fluids 03/11/15   [name], MD  lisinopril-hydrochlorothiazide (PRINZIDE,ZESTORETIC) 20-12.5 MG per tablet Take 1 tablet by mouth daily.    Historical Provider, MD  metoprolol succinate (TOPROL-XL) 25 MG 24 hr tablet Take 25 mg by mouth daily.    Historical Provider, MD   Meds Ordered and Administered this Visit  Medications - No data to display  BP 146/82 mmHg  Pulse 73  Temp(Src) 98.3 F (36.8 C) (Oral)  Resp 16  SpO2 97% No data found.   Physical Exam  Constitutional: She is oriented to person, place, and time. She appears well-developed and well-nourished. No distress.  Abdominal: Soft. Bowel sounds are normal. She exhibits no distension and no mass. There is no tenderness. There is no rebound and no guarding.  Lymphadenopathy:    She has no cervical adenopathy.  Neurological: She is alert and oriented to person, place, and time.  Skin: Skin is warm and dry.  Nursing note and vitals reviewed.   ED Course  Procedures (including critical care time)  Labs Review Labs Reviewed  POCT URINALYSIS DIP (DEVICE) - Abnormal; Notable for the following:    Hgb urine dipstick SMALL (*)    All other components within normal limits  URINE CULTURE  POCT  PREGNANCY, URINE    Imaging Review No results found.   Visual Acuity Review  Right Eye Distance:   Left Eye Distance:   Bilateral Distance:    Right Eye Near:   Left Eye Near:    Bilateral Near:         MDM   1. UTI (lower urinary tract infection)    Meds ordered this encounter  Medications  . cephALEXin (KEFLEX) 500 MG capsule    Sig: Take 1 capsule (500 mg total) by mouth 4 (four) times daily. Take all of medicine and drink lots of fluids    Dispense:  20  capsule    Refill:  0        [name], MD 03/11/15 2045  [name], MD 03/11/15 2045

## 2015-03-13 LAB — URINE CULTURE: Special Requests: NORMAL

## 2015-05-31 DIAGNOSIS — I10 Essential (primary) hypertension: Secondary | ICD-10-CM | POA: Diagnosis not present

## 2015-05-31 DIAGNOSIS — R011 Cardiac murmur, unspecified: Secondary | ICD-10-CM | POA: Diagnosis not present

## 2015-06-15 ENCOUNTER — Ambulatory Visit (INDEPENDENT_AMBULATORY_CARE_PROVIDER_SITE_OTHER): Payer: 59 | Admitting: Cardiovascular Disease

## 2015-06-15 ENCOUNTER — Encounter: Payer: Self-pay | Admitting: Cardiovascular Disease

## 2015-06-15 VITALS — BP 142/88 | HR 94 | Ht 61.0 in | Wt 244.0 lb

## 2015-06-15 DIAGNOSIS — Z136 Encounter for screening for cardiovascular disorders: Secondary | ICD-10-CM | POA: Diagnosis not present

## 2015-06-15 DIAGNOSIS — R0602 Shortness of breath: Secondary | ICD-10-CM

## 2015-06-15 DIAGNOSIS — I10 Essential (primary) hypertension: Secondary | ICD-10-CM | POA: Diagnosis not present

## 2015-06-15 DIAGNOSIS — R011 Cardiac murmur, unspecified: Secondary | ICD-10-CM | POA: Diagnosis not present

## 2015-06-15 DIAGNOSIS — M7989 Other specified soft tissue disorders: Secondary | ICD-10-CM

## 2015-06-15 MED ORDER — METOPROLOL SUCCINATE ER 50 MG PO TB24: 50.0000 mg | ORAL_TABLET | Freq: Every day | ORAL | Status: DC

## 2015-06-15 NOTE — Patient Instructions (Signed)
Your physician has recommended you make the following change in your medication:  Increase metoprolol succinate to 50 mg daily. You may take (2) of your 25 mg tablets daily until they are finished. Continue all other medications the same. Your physician has requested that you have an echocardiogram. Echocardiography is a painless test that uses sound waves to create images of your heart. It provides your doctor with information about the size and shape of your heart and how well your heart's chambers and valves are working. This procedure takes approximately one hour. There are no restrictions for this procedure. Your physician recommends that you schedule a follow-up appointment in: 6 weeks.

## 2015-06-15 NOTE — Progress Notes (Signed)
Patient ID: Kristina Bonilla, female   DOB: Feb 08, 1965, 50 y.o.   MRN: [id number]       CARDIOLOGY CONSULT NOTE  Patient ID: Kristina Bonilla MRN: [id number] DOB/AGE: Apr 12, 1965 50 y.o.  Admit date: (Not on file) Primary Physician: Raiford Simmonds., PA-C Referring Physician: Ileana Roup  Reason for Consultation: murmur  HPI: The patient is a 50 year old woman who is referred for the evaluation of a heart murmur. She underwent a normal stress echocardiogram in September 2010. She has a history of hypertension.  She said she has chest pain which intermittently radiates to the back and was diagnosed with angina in either 2000 and 2004 and was started on metoprolol at that time. She has leg swelling for which she takes Lasix which was diagnosed decades ago. Since before Christmas, she has had progressive exertional dyspnea when walking 20 yards. She has lightheadedness and dizziness while sitting but denies syncope. She denies orthopnea and paroxysmal nocturnal dyspnea.   ECG performed in the office today which I personally reviewed demonstrates normal sinus rhythm with no ischemic ST segment or T-wave abnormalities, nor any arrhythmias, HR 95 bpm.  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   Allergies  Allergen Reactions  . Lisinopril     cough    Current Outpatient Prescriptions  Medication Sig Dispense Refill  . amLODipine (NORVASC) 10 MG tablet Take 10 mg by mouth daily.    . furosemide (LASIX) 20 MG tablet Take 10 mg by mouth 2 (two) times daily.    . metoprolol succinate (TOPROL-XL) 25 MG 24 hr tablet Take 25 mg by mouth daily.     No current facility-administered medications for this visit.    Past Medical History  Diagnosis Date  . Hypertension   . Angina pectoris [facility])     Past Surgical History  Procedure Laterality Date  . Tubal ligation      Social History   Social History  . Marital Status: Married    Spouse Name: N/A  . Number of Children: N/A  .  Years of Education: N/A   Occupational History  . Not on file.   Social History Main Topics  . Smoking status: Never Smoker   . Smokeless tobacco: Never Used  . Alcohol Use: No  . Drug Use: Not on file  . Sexual Activity: Not on file   Other Topics Concern  . Not on file   Social History Narrative     No family history of premature CAD in 1st degree relatives.  Prior to Admission medications   Medication Sig Start Date End Date Taking? Authorizing Provider  amLODipine (NORVASC) 10 MG tablet Take 10 mg by mouth daily.   Yes Historical Provider, MD  furosemide (LASIX) 20 MG tablet Take 10 mg by mouth 2 (two) times daily.   Yes Historical Provider, MD  metoprolol succinate (TOPROL-XL) 25 MG 24 hr tablet Take 25 mg by mouth daily.   Yes Historical Provider, MD     Review of systems complete and found to be negative unless listed above in HPI     Physical exam Blood pressure 142/88, pulse 94, height 5\' 1"  (1.549 m), weight 244 lb (110.678 kg), SpO2 96 %. General: NAD Neck: No JVD, no thyromegaly or thyroid nodule.  Lungs: Clear to auscultation bilaterally with normal respiratory effort. CV: Nondisplaced PMI. Regular rate and rhythm, normal S1/S2, no [id number], 2/6 pansystolic murmur heard throughout precordium.  No peripheral edema.  No carotid bruit.   Abdomen:  Soft, nontender, obese.  Skin: Intact without lesions or rashes.  Neurologic: Alert and oriented x 3.  Psych: Normal affect. Extremities: No clubbing or cyanosis.  HEENT: Normal.   ECG: Most recent ECG reviewed.  Labs:   Lab Results  Component Value Date   WBC 14.7* 03/24/2014   HGB 10.2* 03/24/2014   HCT 32.7* 03/24/2014   MCV 81.5 03/24/2014   PLT 333 03/24/2014   No results for input(s): NA, K, CL, CO2, BUN, CREATININE, CALCIUM, PROT, BILITOT, ALKPHOS, ALT, AST, GLUCOSE in the last 168 hours.  Invalid input(s): LABALBU Lab Results  Component Value Date   TROPONINI 0.01        NO INDICATION  OF MYOCARDIAL INJURY. 10/19/2008   No results found for: CHOL No results found for: HDL No results found for: LDLCALC No results found for: TRIG No results found for: CHOLHDL No results found for: LDLDIRECT       Studies: No results found.  ASSESSMENT AND PLAN:  1. Shortness of breath and cardiac murmur: HR at upper normal limits. Will increase Toprol-XL to 50 mg daily as elevated HR with exertion may be etiology of her symptoms. I will order a 2-D echocardiogram with Doppler to evaluate cardiac structure, function, and regional wall motion.  2. Essential HTN: Mildly elevated. Will monitor given increase in Toprol-XL.  3. Leg swelling: Stable on current dose of Lasix. No changes. I will order a 2-D echocardiogram with Doppler to evaluate cardiac structure, function, and regional wall motion.  Dispo: fu 6 weeks.   Signed: Kate Sable, M.D., F.A.C.C.  06/15/2015, 2:46 PM

## 2015-06-22 ENCOUNTER — Ambulatory Visit (INDEPENDENT_AMBULATORY_CARE_PROVIDER_SITE_OTHER): Payer: 59

## 2015-06-22 ENCOUNTER — Other Ambulatory Visit: Payer: Self-pay

## 2015-06-22 DIAGNOSIS — R0602 Shortness of breath: Secondary | ICD-10-CM

## 2015-06-22 DIAGNOSIS — R011 Cardiac murmur, unspecified: Secondary | ICD-10-CM | POA: Diagnosis not present

## 2015-06-23 ENCOUNTER — Encounter: Payer: Self-pay | Admitting: *Deleted

## 2015-06-28 MED FILL — METOPROLOL SUCC ER 50 MG TA: 50 | 90 days supply | Qty: 90 | Fill #0

## 2015-06-28 MED FILL — FUROSEMIDE 80 MG TABLET: 80 | 90 days supply | Qty: 90 | Fill #0

## 2015-06-28 MED FILL — AMLODIPINE BESYLATE 10 MG T: 10 | 90 days supply | Qty: 90 | Fill #0

## 2015-08-02 ENCOUNTER — Encounter: Payer: Self-pay | Admitting: Cardiovascular Disease

## 2015-08-02 ENCOUNTER — Ambulatory Visit (INDEPENDENT_AMBULATORY_CARE_PROVIDER_SITE_OTHER): Payer: 59 | Admitting: Cardiovascular Disease

## 2015-08-02 VITALS — BP 122/88 | HR 82 | Ht 61.0 in | Wt 239.0 lb

## 2015-08-02 DIAGNOSIS — I10 Essential (primary) hypertension: Secondary | ICD-10-CM

## 2015-08-02 DIAGNOSIS — R0602 Shortness of breath: Secondary | ICD-10-CM

## 2015-08-02 DIAGNOSIS — R011 Cardiac murmur, unspecified: Secondary | ICD-10-CM | POA: Diagnosis not present

## 2015-08-02 DIAGNOSIS — Z136 Encounter for screening for cardiovascular disorders: Secondary | ICD-10-CM | POA: Diagnosis not present

## 2015-08-02 DIAGNOSIS — M7989 Other specified soft tissue disorders: Secondary | ICD-10-CM

## 2015-08-02 NOTE — Patient Instructions (Signed)

## 2015-08-02 NOTE — Progress Notes (Signed)
Patient ID: Kristina Bonilla, female   DOB: 06-02-1965, 50 y.o.   MRN: [id number]      SUBJECTIVE: The patient returns for follow-up after undergoing cardiovascular testing performed for the evaluation of SOB and murmur. Echocardiogram 06/22/15 showed normal left ventricular systolic function and regional wall motion, LVEF 60-65%, mild LVH, normal diastolic function, mildly sclerotic aortic annulus, and mild tricuspid regurgitation.  She is feeling much better. She has lost 5 pounds. She is able to climb a flight of stairs without having to stop at the 10th step.   Review of Systems: As per "subjective", otherwise negative.  Allergies  Allergen Reactions  . Lisinopril     cough    Current Outpatient Prescriptions  Medication Sig Dispense Refill  . amLODipine (NORVASC) 10 MG tablet Take 10 mg by mouth daily.    . furosemide (LASIX) 20 MG tablet Take 10 mg by mouth 2 (two) times daily.    . metoprolol succinate (TOPROL-XL) 50 MG 24 hr tablet Take 1 tablet (50 mg total) by mouth daily. Take with or immediately following a meal. 90 tablet 3   No current facility-administered medications for this visit.    Past Medical History  Diagnosis Date  . Hypertension   . Angina pectoris [facility])     Past Surgical History  Procedure Laterality Date  . Tubal ligation      Social History   Social History  . Marital Status: Married    Spouse Name: N/A  . Number of Children: N/A  . Years of Education: N/A   Occupational History  . Not on file.   Social History Main Topics  . Smoking status: Never Smoker   . Smokeless tobacco: Never Used  . Alcohol Use: No  . Drug Use: Not on file  . Sexual Activity: Not on file   Other Topics Concern  . Not on file   Social History Narrative     Filed Vitals:   08/02/15 1537  BP: 122/88  Pulse: 82  Height: 5\' 1"  (1.549 m)  Weight: 239 lb (108.41 kg)  SpO2: 95%    PHYSICAL EXAM General: NAD Neck: No JVD, no thyromegaly or thyroid nodule.   Lungs: Clear to auscultation bilaterally with normal respiratory effort. CV: Nondisplaced PMI. Regular rate and rhythm, normal S1/S2, no XX123456, 1/6 pansystolic murmur heard throughout precordium. No peripheral edema. No carotid bruit.  Abdomen: Soft, nontender, obese.  Skin: Intact without lesions or rashes.  Neurologic: Alert and oriented x 3.  Psych: Normal affect. Extremities: No clubbing or cyanosis.  HEENT: Normal.     ECG: Most recent ECG reviewed.      ASSESSMENT AND PLAN: 1. Shortness of breath and cardiac murmur: Symptomatic improvement with more optimal HR control. Continue Toprol-XL 50 mg daily. No significant valvular pathology by echo.  2. Essential HTN: Reasonably controlled. No changes.  3. Leg swelling: Stable on current dose of Lasix. No changes.  Dispo: fu 6 months.  Kate Sable, M.D., F.A.C.C.

## 2015-09-13 DIAGNOSIS — Z113 Encounter for screening for infections with a predominantly sexual mode of transmission: Secondary | ICD-10-CM | POA: Diagnosis not present

## 2015-09-13 DIAGNOSIS — Z114 Encounter for screening for human immunodeficiency virus [HIV]: Secondary | ICD-10-CM | POA: Diagnosis not present

## 2015-09-26 MED FILL — FUROSEMIDE 80 MG TABLET: 80 | 90 days supply | Qty: 90 | Fill #1

## 2015-09-26 MED FILL — METOPROLOL SUCC ER 50 MG TA: 50 | 90 days supply | Qty: 90 | Fill #1

## 2015-09-26 MED FILL — AMLODIPINE BESYLATE 10 MG T: 10 | 90 days supply | Qty: 90 | Fill #1

## 2015-09-28 DIAGNOSIS — Z7721 Contact with and (suspected) exposure to potentially hazardous body fluids: Secondary | ICD-10-CM | POA: Diagnosis not present

## 2015-09-28 DIAGNOSIS — N898 Other specified noninflammatory disorders of vagina: Secondary | ICD-10-CM | POA: Diagnosis not present

## 2015-11-14 ENCOUNTER — Other Ambulatory Visit: Payer: Self-pay | Admitting: Physician Assistant

## 2015-11-14 DIAGNOSIS — Z1231 Encounter for screening mammogram for malignant neoplasm of breast: Secondary | ICD-10-CM

## 2015-12-07 ENCOUNTER — Other Ambulatory Visit (HOSPITAL_COMMUNITY): Payer: Self-pay | Admitting: *Deleted

## 2015-12-07 DIAGNOSIS — Z1231 Encounter for screening mammogram for malignant neoplasm of breast: Secondary | ICD-10-CM

## 2015-12-08 ENCOUNTER — Ambulatory Visit (HOSPITAL_COMMUNITY): Payer: 59

## 2015-12-09 ENCOUNTER — Other Ambulatory Visit (HOSPITAL_COMMUNITY): Payer: Self-pay | Admitting: *Deleted

## 2015-12-09 DIAGNOSIS — Z9189 Other specified personal risk factors, not elsewhere classified: Secondary | ICD-10-CM | POA: Diagnosis not present

## 2015-12-09 DIAGNOSIS — R590 Localized enlarged lymph nodes: Secondary | ICD-10-CM | POA: Diagnosis not present

## 2015-12-13 ENCOUNTER — Ambulatory Visit (HOSPITAL_COMMUNITY)
Admission: RE | Admit: 2015-12-13 | Discharge: 2015-12-13 | Disposition: A | Discharge status: Home / Self Care | Payer: 59 | Source: Ambulatory Visit | Attending: *Deleted | Admitting: *Deleted

## 2015-12-13 DIAGNOSIS — R590 Localized enlarged lymph nodes: Secondary | ICD-10-CM | POA: Insufficient documentation

## 2015-12-13 DIAGNOSIS — R928 Other abnormal and inconclusive findings on diagnostic imaging of breast: Secondary | ICD-10-CM | POA: Diagnosis not present

## 2015-12-27 ENCOUNTER — Encounter (HOSPITAL_COMMUNITY): Payer: 59

## 2016-01-09 DIAGNOSIS — I10 Essential (primary) hypertension: Secondary | ICD-10-CM | POA: Diagnosis not present

## 2016-01-09 DIAGNOSIS — Z833 Family history of diabetes mellitus: Secondary | ICD-10-CM | POA: Diagnosis not present

## 2016-01-09 DIAGNOSIS — R600 Localized edema: Secondary | ICD-10-CM | POA: Diagnosis not present

## 2016-01-09 DIAGNOSIS — R233 Spontaneous ecchymoses: Secondary | ICD-10-CM | POA: Diagnosis not present

## 2016-01-10 MED FILL — LOSARTAN POTASSIUM 25 MG TA: 25 | 30 days supply | Qty: 30 | Fill #0

## 2016-01-11 MED FILL — METOPROLOL SUCC ER 50 MG TA: 50 | 90 days supply | Qty: 90 | Fill #2

## 2016-01-11 MED FILL — FUROSEMIDE 80 MG TABLET: 80 | 30 days supply | Qty: 30 | Fill #2

## 2016-01-19 MED FILL — AMLODIPINE BESYLATE 10 MG T: 10 | 30 days supply | Qty: 30 | Fill #0

## 2016-02-14 MED FILL — LOSARTAN POTASSIUM 25 MG TA: 25 | 90 days supply | Qty: 90 | Fill #1

## 2016-02-28 DIAGNOSIS — R739 Hyperglycemia, unspecified: Secondary | ICD-10-CM | POA: Diagnosis not present

## 2016-02-28 DIAGNOSIS — I10 Essential (primary) hypertension: Secondary | ICD-10-CM | POA: Diagnosis not present

## 2016-02-28 DIAGNOSIS — I209 Angina pectoris, unspecified: Secondary | ICD-10-CM | POA: Diagnosis not present

## 2016-02-28 DIAGNOSIS — Z0000 Encounter for general adult medical examination without abnormal findings: Secondary | ICD-10-CM | POA: Diagnosis not present

## 2016-02-28 DIAGNOSIS — R5383 Other fatigue: Secondary | ICD-10-CM | POA: Diagnosis not present

## 2016-03-02 DIAGNOSIS — H5213 Myopia, bilateral: Secondary | ICD-10-CM | POA: Diagnosis not present

## 2016-03-02 DIAGNOSIS — H52223 Regular astigmatism, bilateral: Secondary | ICD-10-CM | POA: Diagnosis not present

## 2016-03-09 MED FILL — AMLODIPINE BESYLATE 10 MG T: 10 | 30 days supply | Qty: 30 | Fill #1

## 2016-03-13 DIAGNOSIS — E559 Vitamin D deficiency, unspecified: Secondary | ICD-10-CM | POA: Diagnosis not present

## 2016-03-13 DIAGNOSIS — Z713 Dietary counseling and surveillance: Secondary | ICD-10-CM | POA: Diagnosis not present

## 2016-03-13 DIAGNOSIS — I10 Essential (primary) hypertension: Secondary | ICD-10-CM | POA: Diagnosis not present

## 2016-03-13 MED FILL — LOSARTAN POTASSIUM 50 MG TA: 50 | 30 days supply | Qty: 30 | Fill #0

## 2016-03-15 MED FILL — RESTASIS MULTIDOSE 0.05% EY: 0.05 | 30 days supply | Qty: 6 | Fill #0

## 2016-04-04 DIAGNOSIS — Z7189 Other specified counseling: Secondary | ICD-10-CM | POA: Diagnosis not present

## 2016-04-04 DIAGNOSIS — E559 Vitamin D deficiency, unspecified: Secondary | ICD-10-CM | POA: Diagnosis not present

## 2016-04-04 DIAGNOSIS — I10 Essential (primary) hypertension: Secondary | ICD-10-CM | POA: Diagnosis not present

## 2016-04-04 DIAGNOSIS — Z0389 Encounter for observation for other suspected diseases and conditions ruled out: Secondary | ICD-10-CM | POA: Diagnosis not present

## 2016-05-29 MED FILL — LOSARTAN POTASSIUM 50 MG TA: 50 | 90 days supply | Qty: 90 | Fill #1

## 2016-05-29 MED FILL — AMLODIPINE BESYLATE 10 MG T: 10 | 90 days supply | Qty: 90 | Fill #2

## 2016-06-07 MED FILL — FUROSEMIDE 80 MG TABLET: 80 | 30 days supply | Qty: 30 | Fill #0

## 2016-10-10 ENCOUNTER — Ambulatory Visit (INDEPENDENT_AMBULATORY_CARE_PROVIDER_SITE_OTHER): Payer: 59 | Admitting: Women's Health

## 2016-10-10 ENCOUNTER — Encounter: Payer: Self-pay | Admitting: Women's Health

## 2016-10-10 VITALS — BP 150/98 | HR 84 | Ht 61.0 in | Wt 250.0 lb

## 2016-10-10 DIAGNOSIS — R1032 Left lower quadrant pain: Secondary | ICD-10-CM | POA: Diagnosis not present

## 2016-10-10 DIAGNOSIS — N920 Excessive and frequent menstruation with regular cycle: Secondary | ICD-10-CM

## 2016-10-10 DIAGNOSIS — Z113 Encounter for screening for infections with a predominantly sexual mode of transmission: Secondary | ICD-10-CM

## 2016-10-10 DIAGNOSIS — N898 Other specified noninflammatory disorders of vagina: Secondary | ICD-10-CM | POA: Diagnosis not present

## 2016-10-10 LAB — POCT WET PREP (WET MOUNT)
CLUE CELLS WET PREP WHIFF POC: NEGATIVE
Trichomonas Wet Prep HPF POC: ABSENT

## 2016-10-10 NOTE — Progress Notes (Addendum)
   [facility] Visit  Patient name: Kristina Bonilla MRN [id number]  Date of birth: 1966-02-04  CC & HPI:  Kristina Bonilla is a 51 y.o.  African American female presenting today for report of vaginal d/c w/ slight odor and itching, used otc monistat 7 which helped, now completely resolved, just wanted to get 'checked out' to make sure everything is ok. Period just started today. Periods are regular, lasting 5-8d, has to wear 2 super pads put together, changes 6-8x/day. Also pain in LLQ when had discharge, still there slightly. Requests STD screening, partner has had another sexual partner.  Patient's last menstrual period was 10/10/2016. The current method of family planning is tubal ligation. Last pap 12/01/14, neg w/ -HRHPV  Pertinent History Reviewed:  Medical & Surgical Hx:   Past medical, surgical, family, and social history reviewed in electronic medical record Medications: Reviewed & Updated - see associated section Allergies: Reviewed in electronic medical record  Objective Findings:  Vitals: BP (!) 150/98 (BP Location: Left Arm, Patient Position: Sitting, Cuff Size: Large)   Pulse 84   Ht 5\' 1"  (1.549 m)   Wt 250 lb (113.4 kg)   LMP 10/10/2016   BMI 47.24 kg/m  Body mass index is 47.24 kg/m.  Physical Examination: General appearance - alert, well appearing, and in no distress Pelvic - mod amt nonodorous menstrual blood Bimanual: no CMT, no abnormalities/tenderness on palpation  Results for orders placed or performed in visit on 10/10/16 (from the past 24 hour(s))  POCT Wet Prep [facility])   Collection Time: 10/10/16  5:04 PM  Result Value Ref Range   Source Wet Prep POC vaginal    WBC, Wet Prep HPF POC none    Bacteria Wet Prep HPF POC None (A) Few   BACTERIA WET PREP MORPHOLOGY POC     Clue Cells Wet Prep HPF POC None None   Clue Cells Wet Prep Whiff POC Negative Whiff    Yeast Wet Prep HPF POC None    KOH Wet Prep POC     Trichomonas Wet Prep HPF POC Absent  Absent     Assessment & Plan:  A:   STD screen  Menorrhagia w/ regular cycle  LLQ pain  Normal vaginal d/c today  P:  GC/CT, HIV, RPR, Hep B today  Return in about 1 week (around 10/17/2016) for pelvic u/s and f/u w/ MD.  Tawnya Crook CNM, WHNP-BC 10/10/2016 5:05 PM

## 2016-10-11 DIAGNOSIS — Z113 Encounter for screening for infections with a predominantly sexual mode of transmission: Secondary | ICD-10-CM | POA: Diagnosis not present

## 2016-10-11 MED FILL — AMLODIPINE BESYLATE 10 MG T: 10 | 10 days supply | Qty: 10 | Fill #0

## 2016-10-11 MED FILL — FUROSEMIDE 80 MG TABLET: 80 | 5 days supply | Qty: 10 | Fill #0

## 2016-10-11 MED FILL — LOSARTAN POTASSIUM 50 MG TA: 50 | 10 days supply | Qty: 10 | Fill #0

## 2016-10-11 MED FILL — METOPROLOL SUCC ER 50 MG TA: 50 | 10 days supply | Qty: 10 | Fill #0

## 2016-10-12 DIAGNOSIS — Z113 Encounter for screening for infections with a predominantly sexual mode of transmission: Secondary | ICD-10-CM | POA: Diagnosis not present

## 2016-10-12 LAB — GC/CHLAMYDIA PROBE AMP
CHLAMYDIA, DNA PROBE: NEGATIVE
NEISSERIA GONORRHOEAE BY PCR: NEGATIVE

## 2016-10-13 LAB — HIV ANTIBODY (ROUTINE TESTING W REFLEX): HIV SCREEN 4TH GENERATION: NONREACTIVE

## 2016-10-13 LAB — RPR: RPR: NONREACTIVE

## 2016-10-13 LAB — HEPATITIS B SURFACE ANTIGEN: Hepatitis B Surface Ag: NEGATIVE

## 2016-10-15 ENCOUNTER — Other Ambulatory Visit: Payer: Self-pay | Admitting: Women's Health

## 2016-10-15 DIAGNOSIS — R1032 Left lower quadrant pain: Secondary | ICD-10-CM

## 2016-10-15 DIAGNOSIS — N920 Excessive and frequent menstruation with regular cycle: Secondary | ICD-10-CM

## 2016-10-15 DIAGNOSIS — I10 Essential (primary) hypertension: Secondary | ICD-10-CM | POA: Diagnosis not present

## 2016-10-15 MED FILL — METOPROLOL TARTRATE 50 MG T: 50 | 30 days supply | Qty: 60 | Fill #0

## 2016-10-15 MED FILL — FUROSEMIDE 80 MG TABLET: 80 | 30 days supply | Qty: 30 | Fill #0

## 2016-10-16 ENCOUNTER — Other Ambulatory Visit: Payer: 59

## 2016-10-16 ENCOUNTER — Ambulatory Visit: Payer: 59 | Admitting: Obstetrics & Gynecology

## 2016-10-16 MED FILL — LOSARTAN POTASSIUM 50 MG TA: 50 | 30 days supply | Qty: 30 | Fill #0

## 2016-10-16 MED FILL — AMLODIPINE BESYLATE 10 MG T: 10 | 30 days supply | Qty: 30 | Fill #0

## 2016-10-31 DIAGNOSIS — I10 Essential (primary) hypertension: Secondary | ICD-10-CM | POA: Diagnosis not present

## 2016-11-05 ENCOUNTER — Other Ambulatory Visit: Payer: 59

## 2016-11-05 ENCOUNTER — Ambulatory Visit: Payer: 59 | Admitting: Obstetrics & Gynecology

## 2016-11-22 MED FILL — FUROSEMIDE 80 MG TABLET: 80 | 30 days supply | Qty: 30 | Fill #1

## 2016-11-22 MED FILL — LOSARTAN POTASSIUM 50 MG TA: 50 | 30 days supply | Qty: 30 | Fill #1

## 2016-11-22 MED FILL — AMLODIPINE BESYLATE 10 MG T: 10 | 30 days supply | Qty: 30 | Fill #1

## 2016-11-22 MED FILL — METOPROLOL TARTRATE 50 MG T: 50 | 30 days supply | Qty: 60 | Fill #1

## 2016-11-26 ENCOUNTER — Encounter (INDEPENDENT_AMBULATORY_CARE_PROVIDER_SITE_OTHER): Payer: 59 | Admitting: Obstetrics & Gynecology

## 2016-11-26 ENCOUNTER — Encounter: Payer: Self-pay | Admitting: Obstetrics & Gynecology

## 2016-11-26 ENCOUNTER — Ambulatory Visit: Payer: 59 | Admitting: Obstetrics & Gynecology

## 2016-11-26 ENCOUNTER — Ambulatory Visit (INDEPENDENT_AMBULATORY_CARE_PROVIDER_SITE_OTHER): Payer: 59

## 2016-11-26 VITALS — BP 132/82 | HR 91 | Ht 60.0 in | Wt 248.0 lb

## 2016-11-26 DIAGNOSIS — R1032 Left lower quadrant pain: Secondary | ICD-10-CM

## 2016-11-26 DIAGNOSIS — N920 Excessive and frequent menstruation with regular cycle: Secondary | ICD-10-CM

## 2016-11-26 DIAGNOSIS — N946 Dysmenorrhea, unspecified: Secondary | ICD-10-CM | POA: Diagnosis not present

## 2016-11-26 DIAGNOSIS — D219 Benign neoplasm of connective and other soft tissue, unspecified: Secondary | ICD-10-CM | POA: Diagnosis not present

## 2016-11-26 MED ORDER — MEGESTROL ACETATE 40 MG PO TABS: ORAL_TABLET | ORAL | 3 refills | Status: DC

## 2016-11-26 NOTE — Progress Notes (Signed)
PELVIC US TA/TV: enlarged heterogeneous anteverted uterus w/ mult fibroids,(#1) anterior right subserosal fibroid 2.5 x 2.9 x 2.5 cm,(#2) post left intramural calcification 9 x 7 mm, EEC 14 mm, 7 x 5 x 9 mm echogenic mass w/in endometrium (? Polyp) no color flow visualized,normal ovaries bilat,ovaries were better visualized on T/A images,no pain during ultrasound,no free fluid

## 2016-11-26 NOTE — Progress Notes (Addendum)
Preoperative History and Physical  Kristina Bonilla is a 51 y.o. [phone] with Patient's last menstrual period was 10/10/2016. admitted for a hysteroscopy D&C endometrial ablation with removal of endometrial polyp.  Patient has had very heavy periods now for quite some time certainly greater than a year.  She bleeds for 5 days heavy heavy with clots she has soiled sheets and closed and is even had to throw away her mattress.  She wears her super Dupre pads were sometimes 2 together and still has episodes of soiling.  Additionally her cramps are just as bad really feels like she is in labor.  No pain during the rest the months and no pain with intercourse.  Her ultrasound shows some fibroids not anything of significance with endometrial polyp Certainly no ultrasound finding that would preclude an endometrial ablation especially at her age of 51 years old  So we will proceed with a hysteroscopy D&C removal of endometrial polyp and a NovaSure endometrial ablation 12/19/2016 I'll treat her with megestrol preoperatively  PMH:    Past Medical History:  Diagnosis Date  . Angina pectoris ([place])   . Hypertension     PSH:     Past Surgical History:  Procedure Laterality Date  . TUBAL LIGATION      POb/GynH:      OB History    Gravida Para Term Preterm AB Living   5 3     2 3    SAB TAB Ectopic Multiple Live Births   1 1            SH:   Social History  Substance Use Topics  . Smoking status: Never Smoker  . Smokeless tobacco: Never Used  . Alcohol use No    FH:    Family History  Problem Relation Age of Onset  . Heart disease Mother   . Hypertension Father   . Stroke Father   . Cancer Paternal Aunt   . Cancer Paternal Aunt      Allergies:  Allergies  Allergen Reactions  . Lisinopril     cough    Medications:       Current Outpatient Prescriptions:  .  amLODipine (NORVASC) 10 MG tablet, Take 10 mg by mouth daily., Disp: , Rfl:  .  furosemide (LASIX) 20 MG tablet, Take 10  mg by mouth 2 (two) times daily., Disp: , Rfl:  .  metoprolol succinate (TOPROL-XL) 50 MG 24 hr tablet, Take 1 tablet (50 mg total) by mouth daily. Take with or immediately following a meal., Disp: 90 tablet, Rfl: 3  Review of Systems:   Review of Systems  Constitutional: Negative for fever, chills, weight loss, malaise/fatigue and diaphoresis.  HENT: Negative for hearing loss, ear pain, nosebleeds, congestion, sore throat, neck pain, tinnitus and ear discharge.   Eyes: Negative for blurred vision, double vision, photophobia, pain, discharge and redness.  Respiratory: Negative for cough, hemoptysis, sputum production, shortness of breath, wheezing and stridor.   Cardiovascular: Negative for chest pain, palpitations, orthopnea, claudication, leg swelling and PND.  Gastrointestinal: Positive for abdominal pain. Negative for heartburn, nausea, vomiting, diarrhea, constipation, blood in stool and melena.  Genitourinary: Negative for dysuria, urgency, frequency, hematuria and flank pain.  Musculoskeletal: Negative for myalgias, back pain, joint pain and falls.  Skin: Negative for itching and rash.  Neurological: Negative for dizziness, tingling, tremors, sensory change, speech change, focal weakness, seizures, loss of consciousness, weakness and headaches.  Endo/Heme/Allergies: Negative for environmental allergies and polydipsia. Does not bruise/bleed easily.  Psychiatric/Behavioral: Negative for depression, suicidal ideas, hallucinations, memory loss and substance abuse. The patient is not nervous/anxious and does not have insomnia.      PHYSICAL EXAM:  Blood pressure 132/82, pulse 91, height 5' (1.524 m), weight 248 lb (112.5 kg), last menstrual period 10/10/2016.    Vitals reviewed. Constitutional: She is oriented to person, place, and time. She appears well-developed and well-nourished.  HENT:  Head: Normocephalic and atraumatic.  Right Ear: External ear normal.  Left Ear: External ear  normal.  Nose: Nose normal.  Mouth/Throat: Oropharynx is clear and moist.  Eyes: Conjunctivae and EOM are normal. Pupils are equal, round, and reactive to light. Right eye exhibits no discharge. Left eye exhibits no discharge. No scleral icterus.  Neck: Normal range of motion. Neck supple. No tracheal deviation present. No thyromegaly present.  Cardiovascular: Normal rate, regular rhythm, normal heart sounds and intact distal pulses.  Exam reveals no gallop and no friction rub.   No murmur heard. Respiratory: Effort normal and breath sounds normal. No respiratory distress. She has no wheezes. She has no rales. She exhibits no tenderness.  GI: Soft. Bowel sounds are normal. She exhibits no distension and no mass. There is tenderness. There is no rebound and no guarding.  Genitourinary:       Vulva is normal without lesions Vagina is pink moist without discharge Cervix normal in appearance and pap is normal Uterus is normal size, contour, position, consistency, mobility, non-tender Adnexa is negative with normal sized ovaries by sonogram  Musculoskeletal: Normal range of motion. She exhibits no edema and no tenderness.  Neurological: She is alert and oriented to person, place, and time. She has normal reflexes. She displays normal reflexes. No cranial nerve deficit. She exhibits normal muscle tone. Coordination normal.  Skin: Skin is warm and dry. No rash noted. No erythema. No pallor.  Psychiatric: She has a normal mood and affect. Her behavior is normal. Judgment and thought content normal.    Labs: No results found for this or any previous visit (from the past 336 hour(s)).  EKG: Orders placed or performed in visit on 06/15/15  . EKG 12-Lead    Imaging Studies:  PACS Images   Show images for US Transvaginal Non-OB  Study Result   GYNECOLOGIC SONOGRAM   Kristina Bonilla is a 51 y.o. [id number] LMP 10/10/2016 she is here for a pelvic sonogram for LLQ pain w/menorrhagia.  Uterus                       11.9 x 6.4 x 7.5 cm, vol 298 ml, enlarged, heterogeneous, anteverted uterus w/ mult fibroids  Endometrium          14 mm, symmetrical,  7 x 5 x 9 mm echogenic mass w/in endometrium (? Polyp) no color flow visualized  Right ovary             2.2 x 2.3 x 1.3 cm, wnl,limited view on TV images  Left ovary                2.4 x 1.9 x 2.4 cm, wnl,limited view on TV images  Fibroids                  (#1) anterior right subserosal fibroid 2.5 x 2.9 x 2.5 cm,(#2) post left intramural calcification 9 x 7 mm  Technician Comments:  PELVIC US TA/TV: enlarged heterogeneous anteverted uterus w/ mult fibroids,(#1) anterior right subserosal fibroid 2.5 x 2.9 x 2.5  cm,(#2) post left intramural calcification 9 x 7 mm, EEC 14 mm, 7 x 5 x 9 mm echogenic mass w/in endometrium (? Polyp) no color flow visualized,normal ovaries bilat,ovaries were better visualized on T/A images,no pain during ultrasound,no free fluid     Silver Huguenin 11/26/2016 4:29 PM        Assessment: Menorrhagia with regular cycle  Dysmenorrhea  Fibroids    Plan: Will use megestrol preoperatively to thin the endometrium preoperatively  Arrangements made for a hysteroscopy D&C endometrial ablation with removal of endometrial polyp on 12/19/2016  French Kendra H 11/26/2016 4:42 PM      Face to face time:  25 minutes  Greater than 50% of the visit time was spent in counseling and coordination of care with the patient.  The summary and outline of the counseling and care coordination is summarized in the note above.   All questions were answered.

## 2016-11-27 MED FILL — MEGESTROL 40 MG TABLET: 40 | 30 days supply | Qty: 45 | Fill #0

## 2016-12-04 DIAGNOSIS — R5383 Other fatigue: Secondary | ICD-10-CM | POA: Diagnosis not present

## 2016-12-04 DIAGNOSIS — R946 Abnormal results of thyroid function studies: Secondary | ICD-10-CM | POA: Diagnosis not present

## 2016-12-04 DIAGNOSIS — I10 Essential (primary) hypertension: Secondary | ICD-10-CM | POA: Diagnosis not present

## 2016-12-04 DIAGNOSIS — E559 Vitamin D deficiency, unspecified: Secondary | ICD-10-CM | POA: Diagnosis not present

## 2016-12-04 DIAGNOSIS — N951 Menopausal and female climacteric states: Secondary | ICD-10-CM | POA: Diagnosis not present

## 2016-12-07 NOTE — Patient Instructions (Signed)
Kristina Bonilla  12/07/2016     @PREFPERIOPPHARMACY @   Your procedure is scheduled on  12/19/2016.  Report to [facility] at  700   A.M.  Call this number if you have problems the morning of surgery:  [phone]   Remember:  Do not eat food or drink liquids after midnight.  Take these medicines the morning of surgery with A SIP OF WATER  Amlodipine, toprol XL.   Do not wear jewelry, make-up or nail polish.  Do not wear lotions, powders, or perfumes, or deoderant.  Do not shave 48 hours prior to surgery.  Men may shave face and neck.  Do not bring valuables to the hospital.  [facility] is not responsible for any belongings or valuables.  Contacts, dentures or bridgework may not be worn into surgery.  Leave your suitcase in the car.  After surgery it may be brought to your room.  For patients admitted to the hospital, discharge time will be determined by your treatment team.  Patients discharged the day of surgery will not be allowed to drive home.   Name and phone number of your driver:   family Special instructions:  None  Please read over the following fact sheets that you were given. Anesthesia Post-op Instructions and Care and Recovery After Surgery       Dilation and Curettage or Vacuum Curettage Dilation and curettage (D&C) and vacuum curettage are minor procedures. A D&C involves stretching (dilation) the cervix and scraping (curettage) the inside lining of the uterus (endometrium). During a D&C, tissue is gently scraped from the endometrium, starting from the top portion of the uterus down to the lowest part of the uterus (cervix). During a vacuum curettage, the lining and tissue in the uterus are removed with the use of gentle suction. Curettage may be performed to either diagnose or treat a problem. As a diagnostic procedure, curettage is performed to examine tissues from the uterus. A diagnostic curettage may be done if you  have:  Irregular bleeding in the uterus.  Bleeding with the development of clots.  Spotting between menstrual periods.  Prolonged menstrual periods or other abnormal bleeding.  Bleeding after menopause.  No menstrual period (amenorrhea).  A change in size and shape of the uterus.  Abnormal endometrial cells discovered during a Pap test.  As a treatment procedure, curettage may be performed for the following reasons:  Removal of an IUD (intrauterine device).  Removal of retained placenta after giving birth.  Abortion.  Miscarriage.  Removal of endometrial polyps.  Removal of uncommon types of noncancerous lumps (fibroids).  Tell a health care provider about:  Any allergies you have, including allergies to prescribed medicine or latex.  All medicines you are taking, including vitamins, herbs, eye drops, creams, and over-the-counter medicines. This is especially important if you take any blood-thinning medicine. Bring a list of all of your medicines to your appointment.  Any problems you or family members have had with anesthetic medicines.  Any blood disorders you have.  Any surgeries you have had.  Your medical history and any medical conditions you have.  Whether you are pregnant or may be pregnant.  Recent vaginal infections you have had.  Recent menstrual periods, bleeding problems you have had, and what form of birth control (contraception) you use. What are the risks? Generally, this is a safe procedure. However, problems may occur, including:  Infection.  Heavy vaginal bleeding.  Allergic reactions to medicines.  Damage to the cervix or other structures or organs.  Development of scar tissue (adhesions) inside the uterus, which can cause abnormal amounts of menstrual bleeding. This may make it harder to get pregnant in the future.  A hole (perforation) or puncture in the uterine wall. This is rare.  What happens before the procedure? Staying  hydrated Follow instructions from your health care provider about hydration, which may include:  Up to 2 hours before the procedure - you may continue to drink clear liquids, such as water, clear fruit juice, black coffee, and plain tea.  Eating and drinking restrictions Follow instructions from your health care provider about eating and drinking, which may include:  8 hours before the procedure - stop eating heavy meals or foods such as meat, fried foods, or fatty foods.  6 hours before the procedure - stop eating light meals or foods, such as toast or cereal.  6 hours before the procedure - stop drinking milk or drinks that contain milk.  2 hours before the procedure - stop drinking clear liquids. If your health care provider told you to take your medicine(s) on the day of your procedure, take them with only a sip of water.  Medicines  Ask your health care provider about: ? Changing or stopping your regular medicines. This is especially important if you are taking diabetes medicines or blood thinners. ? Taking medicines such as aspirin and ibuprofen. These medicines can thin your blood. Do not take these medicines before your procedure if your health care provider instructs you not to.  You may be given antibiotic medicine to help prevent infection. General instructions  For 24 hours before your procedure, do not: ? Douche. ? Use tampons. ? Use medicines, creams, or suppositories in the vagina. ? Have sexual intercourse.  You may be given a pregnancy test on the day of the procedure.  Plan to have someone take you home from the hospital or clinic.  You may have a blood or urine sample taken.  If you will be going home right after the procedure, plan to have someone with you for 24 hours. What happens during the procedure?  To reduce your risk of infection: ? Your health care team will wash or sanitize their hands. ? Your skin will be washed with soap.  An IV tube will be  inserted into one of your veins.  You will be given one of the following: ? A medicine that numbs the area in and around the cervix (local anesthetic). ? A medicine to make you fall asleep (general anesthetic).  You will lie down on your back, with your feet in foot rests (stirrups).  The size and position of your uterus will be checked.  A lubricated instrument (speculum or Sims retractor) will be inserted into the back side of your vagina. The speculum will be used to hold apart the walls of your vagina so your health care provider can see your cervix.  A tool (tenaculum) will be attached to the lip of the cervix to stabilize it.  Your cervix will be softened and dilated. This may be done by: ? Taking a medicine. ? Having tapered dilators or thin rods (laminaria) or gradual widening instruments (tapered dilators) inserted into your cervix.  A small, sharp, curved instrument (curette) will be used to scrape a small amount of tissue or cells from the endometrium or cervical canal. In some cases, gentle suction is applied with  the curette. The curette will then be removed. The cells will be taken to a lab for testing. The procedure may vary among health care providers and hospitals. What happens after the procedure?  You may have mild cramping, backache, pain, and light bleeding or spotting. You may pass small blood clots from your vagina.  You may have to wear compression stockings. These stockings help to prevent blood clots and reduce swelling in your legs.  Your blood pressure, heart rate, breathing rate, and blood oxygen level will be monitored until the medicines you were given have worn off. Summary  Dilation and curettage (D&C) involves stretching (dilation) the cervix and scraping (curettage) the inside lining of the uterus (endometrium).  After the procedure, you may have mild cramping, backache, pain, and light bleeding or spotting. You may pass small blood clots from your  vagina.  Plan to have someone take you home from the hospital or clinic. This information is not intended to replace advice given to you by your health care provider. Make sure you discuss any questions you have with your health care provider. Document Released: 01/22/2005 Document Revised: 10/09/2015 Document Reviewed: 10/09/2015 Elsevier Interactive Patient Education  2018 [employer].  Dilation and Curettage or Vacuum Curettage, Care After These instructions give you information about caring for yourself after your procedure. Your doctor may also give you more specific instructions. Call your doctor if you have any problems or questions after your procedure. Follow these instructions at home: Activity  Do not drive or use heavy machinery while taking prescription pain medicine.  For 24 hours after your procedure, avoid driving.  Take short walks often, followed by rest periods. Ask your doctor what activities are safe for you. After one or two days, you may be able to return to your normal activities.  Do not lift anything that is heavier than 10 lb (4.5 kg) until your doctor approves.  For at least 2 weeks, or as long as told by your doctor: ? Do not douche. ? Do not use tampons. ? Do not have sex. General instructions  Take over-the-counter and prescription medicines only as told by your doctor. This is very important if you take blood thinning medicine.  Do not take baths, swim, or use a hot tub until your doctor approves. Take showers instead of baths.  Wear compression stockings as told by your doctor.  It is up to you to get the results of your procedure. Ask your doctor when your results will be ready.  Keep all follow-up visits as told by your doctor. This is important. Contact a doctor if:  You have very bad cramps that get worse or do not get better with medicine.  You have very bad pain in your belly (abdomen).  You cannot drink fluids without throwing up  (vomiting).  You get pain in a different part of the area between your belly and thighs (pelvis).  You have bad-smelling discharge from your vagina.  You have a rash. Get help right away if:  You are bleeding a lot from your vagina. A lot of bleeding means soaking more than one sanitary pad in an hour, for 2 hours in a row.  You have clumps of blood (blood clots) coming from your vagina.  You have a fever or chills.  Your belly feels very tender or hard.  You have chest pain.  You have trouble breathing.  You cough up blood.  You feel dizzy.  You feel light-headed.  You pass  out (faint).  You have pain in your neck or shoulder area. Summary  Take short walks often, followed by rest periods. Ask your doctor what activities are safe for you. After one or two days, you may be able to return to your normal activities.  Do not lift anything that is heavier than 10 lb (4.5 kg) until your doctor approves.  Do not take baths, swim, or use a hot tub until your doctor approves. Take showers instead of baths.  Contact your doctor if you have any symptoms of infection, like bad-smelling discharge from your vagina. This information is not intended to replace advice given to you by your health care provider. Make sure you discuss any questions you have with your health care provider. Document Released: 11/01/2007 Document Revised: 10/10/2015 Document Reviewed: 10/10/2015 Elsevier Interactive Patient Education  2017 [place]. Hysteroscopy Hysteroscopy is a procedure used for looking inside the womb (uterus). It may be done for various reasons, including:  To evaluate abnormal bleeding, fibroid (benign, noncancerous) tumors, polyps, scar tissue (adhesions), and possibly cancer of the uterus.  To look for lumps (tumors) and other uterine growths.  To look for causes of why a woman cannot get pregnant (infertility), causes of recurrent loss of pregnancy (miscarriages), or a lost  intrauterine device (IUD).  To perform a sterilization by blocking the fallopian tubes from inside the uterus.  In this procedure, a thin, flexible tube with a tiny light and camera on the end of it (hysteroscope) is used to look inside the uterus. A hysteroscopy should be done right after a menstrual period to be sure you are not pregnant. LET [facility] CARE PROVIDER KNOW ABOUT:  Any allergies you have.  All medicines you are taking, including vitamins, herbs, eye drops, creams, and over-the-counter medicines.  Previous problems you or members of your family have had with the use of anesthetics.  Any blood disorders you have.  Previous surgeries you have had.  Medical conditions you have. RISKS AND COMPLICATIONS Generally, this is a safe procedure. However, as with any procedure, complications can occur. Possible complications include:  Putting a hole in the uterus.  Excessive bleeding.  Infection.  Damage to the cervix.  Injury to other organs.  Allergic reaction to medicines.  Too much fluid used in the uterus for the procedure.  BEFORE THE PROCEDURE  Ask your health care provider about changing or stopping any regular medicines.  Do not take aspirin or blood thinners for 1 week before the procedure, or as directed by your health care provider. These can cause bleeding.  If you smoke, do not smoke for 2 weeks before the procedure.  In some cases, a medicine is placed in the cervix the day before the procedure. This medicine makes the cervix have a larger opening (dilate). This makes it easier for the instrument to be inserted into the uterus during the procedure.  Do not eat or drink anything for at least 8 hours before the surgery.  Arrange for someone to take you home after the procedure. PROCEDURE  You may be given a medicine to relax you (sedative). You may also be given one of the following: ? A medicine that numbs the area around the cervix (local  anesthetic). ? A medicine that makes you sleep through the procedure (general anesthetic).  The hysteroscope is inserted through the vagina into the uterus. The camera on the hysteroscope sends a picture to a TV screen. This gives the surgeon a good view inside the  uterus.  During the procedure, air or a liquid is put into the uterus, which allows the surgeon to see better.  Sometimes, tissue is gently scraped from inside the uterus. These tissue samples are sent to a lab for testing. What to expect after the procedure  If you had a general anesthetic, you may be groggy for a couple hours after the procedure.  If you had a local anesthetic, you will be able to go home as soon as you are stable and feel ready.  You may have some cramping. This normally lasts for a couple days.  You may have bleeding, which varies from light spotting for a few days to menstrual-like bleeding for 3-7 days. This is normal.  If your test results are not back during the visit, make an appointment with your health care provider to find out the results. This information is not intended to replace advice given to you by your health care provider. Make sure you discuss any questions you have with your health care provider. Document Released: 04/30/2000 Document Revised: 06/30/2015 Document Reviewed: 08/21/2012 Elsevier Interactive Patient Education  2017 [place]. Hysteroscopy, Care After Refer to this sheet in the next few weeks. These instructions provide you with information on caring for yourself after your procedure. Your health care provider may also give you more specific instructions. Your treatment has been planned according to current medical practices, but problems sometimes occur. Call your health care provider if you have any problems or questions after your procedure. What can I expect after the procedure? After your procedure, it is typical to have the following:  You may have some cramping. This  normally lasts for a couple days.  You may have bleeding. This can vary from light spotting for a few days to menstrual-like bleeding for 3-7 days.  Follow these instructions at home:  Rest for the first 1-2 days after the procedure.  Only take over-the-counter or prescription medicines as directed by your health care provider. Do not take aspirin. It can increase the chances of bleeding.  Take showers instead of baths for 2 weeks or as directed by your health care provider.  Do not drive for 24 hours or as directed.  Do not drink alcohol while taking pain medicine.  Do not use tampons, douche, or have sexual intercourse for 2 weeks or until your health care provider says it is okay.  Take your temperature twice a day for 4-5 days. Write it down each time.  Follow your health care provider's advice about diet, exercise, and lifting.  If you develop constipation, you may: ? Take a mild laxative if your health care provider approves. ? Add bran foods to your diet. ? Drink enough fluids to keep your urine clear or pale yellow.  Try to have someone with you or available to you for the first 24-48 hours, especially if you were given a general anesthetic.  Follow up with your health care provider as directed. Contact a health care provider if:  You feel dizzy or lightheaded.  You feel sick to your stomach (nauseous).  You have abnormal vaginal discharge.  You have a rash.  You have pain that is not controlled with medicine. Get help right away if:  You have bleeding that is heavier than a normal menstrual period.  You have a fever.  You have increasing cramps or pain, not controlled with medicine.  You have new belly (abdominal) pain.  You pass out.  You have pain in the  tops of your shoulders (shoulder strap areas).  You have shortness of breath. This information is not intended to replace advice given to you by your health care provider. Make sure you discuss any  questions you have with your health care provider. Document Released: 11/12/2012 Document Revised: 06/30/2015 Document Reviewed: 08/21/2012 Elsevier Interactive Patient Education  2017 [place].  Endometrial Ablation Endometrial ablation is a procedure that destroys the thin inner layer of the lining of the uterus (endometrium). This procedure may be done:  To stop heavy periods.  To stop bleeding that is causing anemia.  To control irregular bleeding.  To treat bleeding caused by small tumors (fibroids) in the endometrium.  This procedure is often an alternative to major surgery, such as removal of the uterus and cervix (hysterectomy). As a result of this procedure:  You may not be able to have children. However, if you are premenopausal (you have not gone through menopause): ? You may still have a small chance of getting pregnant. ? You will need to use a reliable method of birth control after the procedure to prevent pregnancy.  You may stop having a menstrual period, or you may have only a small amount of bleeding during your period. Menstruation may return several years after the procedure.  Tell a health care provider about:  Any allergies you have.  All medicines you are taking, including vitamins, herbs, eye drops, creams, and over-the-counter medicines.  Any problems you or family members have had with the use of anesthetic medicines.  Any blood disorders you have.  Any surgeries you have had.  Any medical conditions you have. What are the risks? Generally, this is a safe procedure. However, problems may occur, including:  A hole (perforation) in the uterus or bowel.  Infection of the uterus, bladder, or vagina.  Bleeding.  Damage to other structures or organs.  An air bubble in the lung (air embolus).  Problems with pregnancy after the procedure.  Failure of the procedure.  Decreased ability to diagnose cancer in the endometrium.  What happens  before the procedure?  You will have tests of your endometrium to make sure there are no pre-cancerous cells or cancer cells present.  You may have an ultrasound of the uterus.  You may be given medicines to thin the endometrium.  Ask your health care provider about: ? Changing or stopping your regular medicines. This is especially important if you take diabetes medicines or blood thinners. ? Taking medicines such as aspirin and ibuprofen. These medicines can thin your blood. Do not take these medicines before your procedure if your doctor tells you not to.  Plan to have someone take you home from the hospital or clinic. What happens during the procedure?  You will lie on an exam table with your feet and legs supported as in a pelvic exam.  To lower your risk of infection: ? Your health care team will wash or sanitize their hands and put on germ-free (sterile) gloves. ? Your genital area will be washed with soap.  An IV tube will be inserted into one of your veins.  You will be given a medicine to help you relax (sedative).  A surgical instrument with a light and camera (resectoscope) will be inserted into your vagina and moved into your uterus. This allows your surgeon to see inside your uterus.  Endometrial tissue will be removed using one of the following methods: ? Radiofrequency. This method uses a radiofrequency-alternating electric current to remove the endometrium. ?  Cryotherapy. This method uses extreme cold to freeze the endometrium. ? Heated-free liquid. This method uses a heated saltwater (saline) solution to remove the endometrium. ? Microwave. This method uses high-energy microwaves to heat up the endometrium and remove it. ? Thermal balloon. This method involves inserting a catheter with a balloon tip into the uterus. The balloon tip is filled with heated fluid to remove the endometrium. The procedure may vary among health care providers and hospitals. What happens  after the procedure?  Your blood pressure, heart rate, breathing rate, and blood oxygen level will be monitored until the medicines you were given have worn off.  As tissue healing occurs, you may notice vaginal bleeding for 4-6 weeks after the procedure. You may also experience: ? Cramps. ? Thin, watery vaginal discharge that is light pink or brown in color. ? A need to urinate more frequently than usual. ? Nausea.  Do not drive for 24 hours if you were given a sedative.  Do not have sex or insert anything into your vagina until your health care provider approves. Summary  Endometrial ablation is done to treat the many causes of heavy menstrual bleeding.  The procedure may be done only after medications have been tried to control the bleeding.  Plan to have someone take you home from the hospital or clinic. This information is not intended to replace advice given to you by your health care provider. Make sure you discuss any questions you have with your health care provider. Document Released: 12/02/2003 Document Revised: 02/09/2016 Document Reviewed: 02/09/2016 Elsevier Interactive Patient Education  2017 [place] Anesthesia, Adult General anesthesia is the use of medicines to make a person "go to sleep" (be unconscious) for a medical procedure. General anesthesia is often recommended when a procedure:  Is long.  Requires you to be still or in an unusual position.  Is major and can cause you to lose blood.  Is impossible to do without general anesthesia.  The medicines used for general anesthesia are called general anesthetics. In addition to making you sleep, the medicines:  Prevent pain.  Control your blood pressure.  Relax your muscles.  Tell a health care provider about:  Any allergies you have.  All medicines you are taking, including vitamins, herbs, eye drops, creams, and over-the-counter medicines.  Any problems you or family members have had  with anesthetic medicines.  Types of anesthetics you have had in the past.  Any bleeding disorders you have.  Any surgeries you have had.  Any medical conditions you have.  Any history of heart or lung conditions, such as heart failure, sleep apnea, or chronic obstructive pulmonary disease (COPD).  Whether you are pregnant or may be pregnant.  Whether you use tobacco, alcohol, marijuana, or street drugs.  Any history of Armed forces logistics/support/administrative officer.  Any history of depression or anxiety. What are the risks? Generally, this is a safe procedure. However, problems may occur, including:  Allergic reaction to anesthetics.  Lung and heart problems.  Inhaling food or liquids from your stomach into your lungs (aspiration).  Injury to nerves.  Waking up during your procedure and being unable to move (rare).  Extreme agitation or a state of mental confusion (delirium) when you wake up from the anesthetic.  Air in the bloodstream, which can lead to stroke.  These problems are more likely to develop if you are having a major surgery or if you have an advanced medical condition. You can prevent some of these complications by  answering all of your health care provider's questions thoroughly and by following all pre-procedure instructions. General anesthesia can cause side effects, including:  Nausea or vomiting  A sore throat from the breathing tube.  Feeling cold or shivery.  Feeling tired, washed out, or achy.  Sleepiness or drowsiness.  Confusion or agitation.  What happens before the procedure? Staying hydrated Follow instructions from your health care provider about hydration, which may include:  Up to 2 hours before the procedure - you may continue to drink clear liquids, such as water, clear fruit juice, black coffee, and plain tea.  Eating and drinking restrictions Follow instructions from your health care provider about eating and drinking, which may include:  8 hours  before the procedure - stop eating heavy meals or foods such as meat, fried foods, or fatty foods.  6 hours before the procedure - stop eating light meals or foods, such as toast or cereal.  6 hours before the procedure - stop drinking milk or drinks that contain milk.  2 hours before the procedure - stop drinking clear liquids.  Medicines  Ask your health care provider about: ? Changing or stopping your regular medicines. This is especially important if you are taking diabetes medicines or blood thinners. ? Taking medicines such as aspirin and ibuprofen. These medicines can thin your blood. Do not take these medicines before your procedure if your health care provider instructs you not to. ? Taking new dietary supplements or medicines. Do not take these during the week before your procedure unless your health care provider approves them.  If you are told to take a medicine or to continue taking a medicine on the day of the procedure, take the medicine with sips of water. General instructions   Ask if you will be going home the same day, the following day, or after a longer hospital stay. ? Plan to have someone take you home. ? Plan to have someone stay with you for the first 24 hours after you leave the hospital or clinic.  For 3-6 weeks before the procedure, try not to use any tobacco products, such as cigarettes, chewing tobacco, and e-cigarettes.  You may brush your teeth on the morning of the procedure, but make sure to spit out the toothpaste. What happens during the procedure?  You will be given anesthetics through a mask and through an IV tube in one of your veins.  You may receive medicine to help you relax (sedative).  As soon as you are asleep, a breathing tube may be used to help you breathe.  An anesthesia specialist will stay with you throughout the procedure. He or she will help keep you comfortable and safe by continuing to give you medicines and adjusting the amount  of medicine that you get. He or she will also watch your blood pressure, pulse, and oxygen levels to make sure that the anesthetics do not cause any problems.  If a breathing tube was used to help you breathe, it will be removed before you wake up. The procedure may vary among health care providers and hospitals. What happens after the procedure?  You will wake up, often slowly, after the procedure is complete, usually in a recovery area.  Your blood pressure, heart rate, breathing rate, and blood oxygen level will be monitored until the medicines you were given have worn off.  You may be given medicine to help you calm down if you feel anxious or agitated.  If you will be going  home the same day, your health care provider may check to make sure you can stand, drink, and urinate.  Your health care providers will treat your pain and side effects before you go home.  Do not drive for 24 hours if you received a sedative.  You may: ? Feel nauseous and vomit. ? Have a sore throat. ? Have mental slowness. ? Feel cold or shivery. ? Feel sleepy. ? Feel tired. ? Feel sore or achy, even in parts of your body where you did not have surgery. This information is not intended to replace advice given to you by your health care provider. Make sure you discuss any questions you have with your health care provider. Document Released: 05/01/2007 Document Revised: 07/05/2015 Document Reviewed: 01/06/2015 Elsevier Interactive Patient Education  2018 [employer] Anesthesia, Adult, Care After These instructions provide you with information about caring for yourself after your procedure. Your health care provider may also give you more specific instructions. Your treatment has been planned according to current medical practices, but problems sometimes occur. Call your health care provider if you have any problems or questions after your procedure. What can I expect after the procedure? After the  procedure, it is common to have:  Vomiting.  A sore throat.  Mental slowness.  It is common to feel:  Nauseous.  Cold or shivery.  Sleepy.  Tired.  Sore or achy, even in parts of your body where you did not have surgery.  Follow these instructions at home: For at least 24 hours after the procedure:  Do not: ? Participate in activities where you could fall or become injured. ? Drive. ? Use heavy machinery. ? Drink alcohol. ? Take sleeping pills or medicines that cause drowsiness. ? Make important decisions or sign legal documents. ? Take care of children on your own.  Rest. Eating and drinking  If you vomit, drink water, juice, or soup when you can drink without vomiting.  Drink enough fluid to keep your urine clear or pale yellow.  Make sure you have little or no nausea before eating solid foods.  Follow the diet recommended by your health care provider. General instructions  Have a responsible adult stay with you until you are awake and alert.  Return to your normal activities as told by your health care provider. Ask your health care provider what activities are safe for you.  Take over-the-counter and prescription medicines only as told by your health care provider.  If you smoke, do not smoke without supervision.  Keep all follow-up visits as told by your health care provider. This is important. Contact a health care provider if:  You continue to have nausea or vomiting at home, and medicines are not helpful.  You cannot drink fluids or start eating again.  You cannot urinate after 8-12 hours.  You develop a skin rash.  You have fever.  You have increasing redness at the site of your procedure. Get help right away if:  You have difficulty breathing.  You have chest pain.  You have unexpected bleeding.  You feel that you are having a life-threatening or urgent problem. This information is not intended to replace advice given to you by your  health care provider. Make sure you discuss any questions you have with your health care provider. Document Released: 04/30/2000 Document Revised: 06/27/2015 Document Reviewed: 01/06/2015 Elsevier Interactive Patient Education  [employer].

## 2016-12-13 ENCOUNTER — Other Ambulatory Visit: Payer: Self-pay | Admitting: Obstetrics & Gynecology

## 2016-12-14 ENCOUNTER — Encounter (HOSPITAL_COMMUNITY): Payer: Self-pay

## 2016-12-14 ENCOUNTER — Other Ambulatory Visit: Payer: Self-pay

## 2016-12-14 ENCOUNTER — Encounter (HOSPITAL_COMMUNITY)
Admission: RE | Admit: 2016-12-14 | Discharge: 2016-12-14 | Disposition: A | Discharge status: Home / Self Care | Payer: 59 | Source: Ambulatory Visit | Attending: Obstetrics & Gynecology | Admitting: Obstetrics & Gynecology

## 2016-12-14 DIAGNOSIS — Z01818 Encounter for other preprocedural examination: Secondary | ICD-10-CM | POA: Diagnosis not present

## 2016-12-14 HISTORY — DX: Unspecified convulsions: R56.9

## 2016-12-14 LAB — URINALYSIS, ROUTINE W REFLEX MICROSCOPIC
Bacteria, UA: NONE SEEN
Bilirubin Urine: NEGATIVE
Glucose, UA: NEGATIVE mg/dL
Ketones, ur: NEGATIVE mg/dL
Nitrite: NEGATIVE
PH: 5 (ref 5.0–8.0)
Protein, ur: NEGATIVE mg/dL
SPECIFIC GRAVITY, URINE: 1.019 (ref 1.005–1.030)

## 2016-12-14 LAB — COMPREHENSIVE METABOLIC PANEL
ALBUMIN: 3.3 g/dL — AB (ref 3.5–5.0)
ALK PHOS: 99 U/L (ref 38–126)
ALT: 13 U/L — AB (ref 14–54)
ANION GAP: 5 (ref 5–15)
AST: 16 U/L (ref 15–41)
BILIRUBIN TOTAL: 0.6 mg/dL (ref 0.3–1.2)
BUN: 12 mg/dL (ref 6–20)
CHLORIDE: 106 mmol/L (ref 101–111)
CO2: 26 mmol/L (ref 22–32)
Calcium: 9 mg/dL (ref 8.9–10.3)
Creatinine, Ser: 0.58 mg/dL (ref 0.44–1.00)
GFR calc Af Amer: >60 mL/min (ref >60)
GFR calc non Af Amer: >60 mL/min (ref >60)
GLUCOSE: 107 mg/dL — AB (ref 65–99)
POTASSIUM: 3.9 mmol/L (ref 3.5–5.1)
Sodium: 137 mmol/L (ref 135–145)
TOTAL PROTEIN: 7.5 g/dL (ref 6.5–8.1)

## 2016-12-14 LAB — CBC
HEMATOCRIT: 33.7 % — AB (ref 36.0–46.0)
HEMOGLOBIN: 10.8 g/dL — AB (ref 12.0–15.0)
MCH: 26 pg (ref 26.0–34.0)
MCHC: 32 g/dL (ref 30.0–36.0)
MCV: 81 fL (ref 78.0–100.0)
Platelets: 368 10*3/uL (ref 150–400)
RBC: 4.16 MIL/uL (ref 3.87–5.11)
RDW: 17.2 % — ABNORMAL HIGH (ref 11.5–15.5)
WBC: 11.5 10*3/uL — AB (ref 4.0–10.5)

## 2016-12-14 LAB — RAPID HIV SCREEN (HIV 1/2 AB+AG)
HIV 1/2 ANTIBODIES: NONREACTIVE
HIV-1 P24 Antigen - HIV24: NONREACTIVE

## 2016-12-14 LAB — HCG, QUANTITATIVE, PREGNANCY: hCG, Beta Chain, Quant, S: <1 m[IU]/mL (ref <5)

## 2016-12-18 NOTE — H&P (Signed)
Preoperative History and Physical  Kristina Bonilla is a 51 y.o. [phone] with Patient's last menstrual period was 10/10/2016. admitted for a hysteroscopy D&C endometrial ablation with removal of endometrial polyp.  Patient has had very heavy periods now for quite some time certainly greater than a year.  She bleeds for 5 days heavy heavy with clots she has soiled sheets and closed and is even had to throw away her mattress.  She wears her super Dupre pads were sometimes 2 together and still has episodes of soiling.  Additionally her cramps are just as bad really feels like she is in labor.  No pain during the rest the months and no pain with intercourse.  Her ultrasound shows some fibroids not anything of significance with endometrial polyp Certainly no ultrasound finding that would preclude an endometrial ablation especially at her age of 51 years old  So we will proceed with a hysteroscopy D&C removal of endometrial polyp and a NovaSure endometrial ablation 12/19/2016 I'll treat her with megestrol preoperatively  PMH:        Past Medical History:  Diagnosis Date  . Angina pectoris ([place])   . Hypertension     PSH:          Past Surgical History:  Procedure Laterality Date  . TUBAL LIGATION      POb/GynH:      OB History    Gravida Para Term Preterm AB Living   5 3     2 3    SAB TAB Ectopic Multiple Live Births   1 1            SH:       Social History  Substance Use Topics  . Smoking status: Never Smoker  . Smokeless tobacco: Never Used  . Alcohol use No    FH:         Family History  Problem Relation Age of Onset  . Heart disease Mother   . Hypertension Father   . Stroke Father   . Cancer Paternal Aunt   . Cancer Paternal Aunt      Allergies:       Allergies  Allergen Reactions  . Lisinopril     cough    Medications:       Current Outpatient Prescriptions:  .  amLODipine (NORVASC) 10 MG tablet, Take 10 mg by mouth  daily., Disp: , Rfl:  .  furosemide (LASIX) 20 MG tablet, Take 10 mg by mouth 2 (two) times daily., Disp: , Rfl:  .  metoprolol succinate (TOPROL-XL) 50 MG 24 hr tablet, Take 1 tablet (50 mg total) by mouth daily. Take with or immediately following a meal., Disp: 90 tablet, Rfl: 3  Review of Systems:   Review of Systems  Constitutional: Negative for fever, chills, weight loss, malaise/fatigue and diaphoresis.  HENT: Negative for hearing loss, ear pain, nosebleeds, congestion, sore throat, neck pain, tinnitus and ear discharge.   Eyes: Negative for blurred vision, double vision, photophobia, pain, discharge and redness.  Respiratory: Negative for cough, hemoptysis, sputum production, shortness of breath, wheezing and stridor.   Cardiovascular: Negative for chest pain, palpitations, orthopnea, claudication, leg swelling and PND.  Gastrointestinal: Positive for abdominal pain. Negative for heartburn, nausea, vomiting, diarrhea, constipation, blood in stool and melena.  Genitourinary: Negative for dysuria, urgency, frequency, hematuria and flank pain.  Musculoskeletal: Negative for myalgias, back pain, joint pain and falls.  Skin: Negative for itching and rash.  Neurological: Negative for dizziness, tingling, tremors, sensory change, speech  change, focal weakness, seizures, loss of consciousness, weakness and headaches.  Endo/Heme/Allergies: Negative for environmental allergies and polydipsia. Does not bruise/bleed easily.  Psychiatric/Behavioral: Negative for depression, suicidal ideas, hallucinations, memory loss and substance abuse. The patient is not nervous/anxious and does not have insomnia.      PHYSICAL EXAM:  Blood pressure 132/82, pulse 91, height 5' (1.524 m), weight 248 lb (112.5 kg), last menstrual period 10/10/2016.    Vitals reviewed. Constitutional: She is oriented to person, place, and time. She appears well-developed and well-nourished.  HENT:  Head: Normocephalic  and atraumatic.  Right Ear: External ear normal.  Left Ear: External ear normal.  Nose: Nose normal.  Mouth/Throat: Oropharynx is clear and moist.  Eyes: Conjunctivae and EOM are normal. Pupils are equal, round, and reactive to light. Right eye exhibits no discharge. Left eye exhibits no discharge. No scleral icterus.  Neck: Normal range of motion. Neck supple. No tracheal deviation present. No thyromegaly present.  Cardiovascular: Normal rate, regular rhythm, normal heart sounds and intact distal pulses.  Exam reveals no gallop and no friction rub.   No murmur heard. Respiratory: Effort normal and breath sounds normal. No respiratory distress. She has no wheezes. She has no rales. She exhibits no tenderness.  GI: Soft. Bowel sounds are normal. She exhibits no distension and no mass. There is tenderness. There is no rebound and no guarding.  Genitourinary:       Vulva is normal without lesions Vagina is pink moist without discharge Cervix normal in appearance and pap is normal Uterus is normal size, contour, position, consistency, mobility, non-tender Adnexa is negative with normal sized ovaries by sonogram  Musculoskeletal: Normal range of motion. She exhibits no edema and no tenderness.  Neurological: She is alert and oriented to person, place, and time. She has normal reflexes. She displays normal reflexes. No cranial nerve deficit. She exhibits normal muscle tone. Coordination normal.  Skin: Skin is warm and dry. No rash noted. No erythema. No pallor.  Psychiatric: She has a normal mood and affect. Her behavior is normal. Judgment and thought content normal.    Labs: No results found for this or any previous visit (from the past 336 hour(s)).  EKG:    Orders placed or performed in visit on 06/15/15  . EKG 12-Lead    Imaging Studies:  PACS Images   Show images for US Transvaginal Non-OB  Study Result   GYNECOLOGIC SONOGRAM   Kristina Ueda Smithis a [id number]  LMP 10/10/2016 she is here for a pelvic sonogram for LLQ pain w/menorrhagia.  Uterus 11.9 x 6.4 x 7.5 cm, vol 298 ml, enlarged, heterogeneous, anteverted uterus w/ mult fibroids  Endometrium 14 mm, symmetrical, 7 x 5 x 9 mm echogenic mass w/in endometrium (? Polyp) no color flow visualized  Right ovary 2.2 x 2.3 x 1.3 cm, wnl,limited view on TV images  Left ovary 2.4 x 1.9 x 2.4 cm, wnl,limited view on TV images  Fibroids (#1) anterior right subserosal fibroid 2.5 x 2.9 x 2.5 cm,(#2) post left intramural calcification 9 x 7 mm  Technician Comments:  PELVIC US TA/TV: enlarged heterogeneous anteverted uterus w/ mult fibroids,(#1) anterior right subserosal fibroid 2.5 x 2.9 x 2.5 cm,(#2) post left intramural calcification 9 x 7 mm, EEC 14 mm, 7 x 5 x 9 mm echogenic mass w/in endometrium (? Polyp) no color flow visualized,normal ovaries bilat,ovaries were better visualized on T/A images,no pain during ultrasound,no free fluid     [employer] 11/26/2016 4:29 PM  Assessment: Menorrhagia with regular cycle  Dysmenorrhea  Fibroids    Plan: Will use megestrol preoperatively to thin the endometrium preoperatively  Arrangements made for a hysteroscopy D&C endometrial ablation with removal of endometrial polyp on 12/19/2016  Amaree Leeper H 11/26/2016 4:42 PM

## 2016-12-19 ENCOUNTER — Ambulatory Visit (HOSPITAL_COMMUNITY): Payer: 59 | Admitting: Anesthesiology

## 2016-12-19 ENCOUNTER — Encounter (HOSPITAL_COMMUNITY): Payer: Self-pay | Admitting: *Deleted

## 2016-12-19 ENCOUNTER — Ambulatory Visit (HOSPITAL_COMMUNITY)
Admission: RE | Admit: 2016-12-19 | Discharge: 2016-12-19 | Disposition: A | Discharge status: Home / Self Care | Payer: 59 | Source: Ambulatory Visit | Attending: Obstetrics & Gynecology | Admitting: Obstetrics & Gynecology

## 2016-12-19 ENCOUNTER — Encounter (HOSPITAL_COMMUNITY)
Admission: RE | Disposition: A | Discharge status: Home / Self Care | Payer: Self-pay | Source: Ambulatory Visit | Attending: Obstetrics & Gynecology

## 2016-12-19 DIAGNOSIS — N921 Excessive and frequent menstruation with irregular cycle: Secondary | ICD-10-CM | POA: Insufficient documentation

## 2016-12-19 DIAGNOSIS — Z79899 Other long term (current) drug therapy: Secondary | ICD-10-CM | POA: Insufficient documentation

## 2016-12-19 DIAGNOSIS — N920 Excessive and frequent menstruation with regular cycle: Secondary | ICD-10-CM | POA: Diagnosis not present

## 2016-12-19 DIAGNOSIS — N840 Polyp of corpus uteri: Secondary | ICD-10-CM | POA: Diagnosis not present

## 2016-12-19 DIAGNOSIS — I10 Essential (primary) hypertension: Secondary | ICD-10-CM | POA: Insufficient documentation

## 2016-12-19 DIAGNOSIS — N946 Dysmenorrhea, unspecified: Secondary | ICD-10-CM | POA: Insufficient documentation

## 2016-12-19 HISTORY — PX: DILITATION & CURRETTAGE/HYSTROSCOPY WITH NOVASURE ABLATION: SHX5568

## 2016-12-19 SURGERY — DILATATION & CURETTAGE/HYSTEROSCOPY WITH NOVASURE ABLATION
Anesthesia: General

## 2016-12-19 MED ORDER — ONDANSETRON 8 MG PO TBDP: 8.0000 mg | ORAL_TABLET | Freq: Three times a day (TID) | ORAL | 0 refills | Status: DC | PRN

## 2016-12-19 MED ORDER — KETOROLAC TROMETHAMINE 30 MG/ML IJ SOLN
INTRAMUSCULAR | Status: AC
  Filled 2016-12-19: qty 1

## 2016-12-19 MED ORDER — FENTANYL CITRATE (PF) 100 MCG/2ML IJ SOLN
INTRAMUSCULAR | Status: AC
  Filled 2016-12-19: qty 2

## 2016-12-19 MED ORDER — GLYCOPYRROLATE 0.2 MG/ML IJ SOLN
INTRAMUSCULAR | Status: AC
  Filled 2016-12-19: qty 1

## 2016-12-19 MED ORDER — LACTATED RINGERS IV SOLN
INTRAVENOUS | Status: DC
  Administered 2016-12-19: 08:00:00 via INTRAVENOUS

## 2016-12-19 MED ORDER — LIDOCAINE HCL 1 % IJ SOLN
INTRAMUSCULAR | Status: DC | PRN
Start: 2016-12-19 — End: 2016-12-19
  Administered 2016-12-19: 30 mg via INTRADERMAL

## 2016-12-19 MED ORDER — MIDAZOLAM HCL 2 MG/2ML IJ SOLN
1.0000 mg | INTRAMUSCULAR | Status: AC
  Administered 2016-12-19: 2 mg via INTRAVENOUS

## 2016-12-19 MED ORDER — HYDROCODONE-ACETAMINOPHEN 5-325 MG PO TABS: 1.0000 | ORAL_TABLET | Freq: Four times a day (QID) | ORAL | 0 refills | Status: DC | PRN

## 2016-12-19 MED ORDER — KETOROLAC TROMETHAMINE 10 MG PO TABS: 10.0000 mg | ORAL_TABLET | Freq: Three times a day (TID) | ORAL | 0 refills | Status: DC | PRN

## 2016-12-19 MED ORDER — ONDANSETRON HCL 4 MG/2ML IJ SOLN
INTRAMUSCULAR | Status: AC
  Filled 2016-12-19: qty 2

## 2016-12-19 MED ORDER — CEFAZOLIN SODIUM-DEXTROSE 2-4 GM/100ML-% IV SOLN
INTRAVENOUS | Status: AC
  Filled 2016-12-19: qty 100

## 2016-12-19 MED ORDER — MIDAZOLAM HCL 2 MG/2ML IJ SOLN
INTRAMUSCULAR | Status: AC
  Filled 2016-12-19: qty 2

## 2016-12-19 MED ORDER — FENTANYL CITRATE (PF) 100 MCG/2ML IJ SOLN: 25.0000 ug | INTRAMUSCULAR | Status: DC | PRN

## 2016-12-19 MED ORDER — ONDANSETRON HCL 4 MG/2ML IJ SOLN
4.0000 mg | Freq: Once | INTRAMUSCULAR | Status: AC
  Administered 2016-12-19: 4 mg via INTRAVENOUS

## 2016-12-19 MED ORDER — PROPOFOL 10 MG/ML IV BOLUS
INTRAVENOUS | Status: DC | PRN
  Administered 2016-12-19: 170 mg via INTRAVENOUS

## 2016-12-19 MED ORDER — KETOROLAC TROMETHAMINE 30 MG/ML IJ SOLN
30.0000 mg | Freq: Once | INTRAMUSCULAR | Status: AC
  Administered 2016-12-19: 30 mg via INTRAVENOUS

## 2016-12-19 MED ORDER — GLYCOPYRROLATE 0.2 MG/ML IJ SOLN
0.2000 mg | Freq: Once | INTRAMUSCULAR | Status: AC
  Administered 2016-12-19: 0.2 mg via INTRAVENOUS

## 2016-12-19 MED ORDER — MIDAZOLAM HCL 5 MG/5ML IJ SOLN
INTRAMUSCULAR | Status: DC | PRN
  Administered 2016-12-19: 2 mg via INTRAVENOUS

## 2016-12-19 MED ORDER — LIDOCAINE HCL (PF) 1 % IJ SOLN
INTRAMUSCULAR | Status: AC
  Filled 2016-12-19: qty 5

## 2016-12-19 MED ORDER — CEFAZOLIN SODIUM-DEXTROSE 2-4 GM/100ML-% IV SOLN
2.0000 g | INTRAVENOUS | Status: AC
Start: 2016-12-19 — End: 2016-12-19
  Administered 2016-12-19: 2 g via INTRAVENOUS

## 2016-12-19 MED ORDER — FENTANYL CITRATE (PF) 100 MCG/2ML IJ SOLN
INTRAMUSCULAR | Status: DC | PRN
  Administered 2016-12-19 (×2): 50 ug via INTRAVENOUS

## 2016-12-19 SURGICAL SUPPLY — 29 items
ABLATOR ENDOMETRIAL BIPOLAR (ABLATOR) ×2 IMPLANT
BAG HAMPER (MISCELLANEOUS) ×2 IMPLANT
CLOTH BEACON ORANGE TIMEOUT ST (SAFETY) ×2 IMPLANT
COVER LIGHT HANDLE STERIS (MISCELLANEOUS) ×4 IMPLANT
FORMALIN 10 PREFIL 120ML (MISCELLANEOUS) ×2 IMPLANT
GAUZE SPONGE 4X4 16PLY XRAY LF (GAUZE/BANDAGES/DRESSINGS) ×2 IMPLANT
GLOVE BIOGEL PI IND STRL 7.0 (GLOVE) ×2 IMPLANT
GLOVE BIOGEL PI IND STRL 8 (GLOVE) ×1 IMPLANT
GLOVE BIOGEL PI INDICATOR 7.0 (GLOVE) ×2
GLOVE BIOGEL PI INDICATOR 8 (GLOVE) ×1
GLOVE ECLIPSE 8.0 STRL XLNG CF (GLOVE) IMPLANT
GLOVE SURG SS PI 6.5 STRL IVOR (GLOVE) ×2 IMPLANT
GLOVE SURG SS PI 8.0 STRL IVOR (GLOVE) ×2 IMPLANT
GOWN STRL REUS W/TWL LRG LVL3 (GOWN DISPOSABLE) ×2 IMPLANT
GOWN STRL REUS W/TWL XL LVL3 (GOWN DISPOSABLE) ×2 IMPLANT
INST SET HYSTEROSCOPY (KITS) ×2 IMPLANT
IV NS 1000ML (IV SOLUTION) ×1
IV NS 1000ML BAXH (IV SOLUTION) ×1 IMPLANT
KIT ROOM TURNOVER AP CYSTO (KITS) ×2 IMPLANT
MANIFOLD NEPTUNE II (INSTRUMENTS) ×2 IMPLANT
NS IRRIG 1000ML POUR BTL (IV SOLUTION) ×2 IMPLANT
PACK BASIC III (CUSTOM PROCEDURE TRAY) ×1
PACK SRG BSC III STRL LF ECLPS (CUSTOM PROCEDURE TRAY) ×1 IMPLANT
PAD ARMBOARD 7.5X6 YLW CONV (MISCELLANEOUS) ×2 IMPLANT
PAD TELFA 3X4 1S STER (GAUZE/BANDAGES/DRESSINGS) ×2 IMPLANT
SET BASIN LINEN APH (SET/KITS/TRAYS/PACK) ×2 IMPLANT
SET IRRIG Y TYPE TUR BLADDER L (SET/KITS/TRAYS/PACK) ×2 IMPLANT
SHEET LAVH (DRAPES) ×2 IMPLANT
YANKAUER SUCT BULB TIP 10FT TU (MISCELLANEOUS) ×2 IMPLANT

## 2016-12-19 NOTE — Addendum Note (Signed)
Addendum  created 12/19/16 0926 by Charmaine Downs, CRNA   Intraprocedure Meds edited

## 2016-12-19 NOTE — Anesthesia Procedure Notes (Signed)
Procedure Name: LMA Insertion Date/Time: 12/19/2016 8:41 AM Performed by: Charmaine Downs, CRNA Pre-anesthesia Checklist: Patient identified, Patient being monitored, Emergency Drugs available, Timeout performed and Suction available Patient Re-evaluated:Patient Re-evaluated prior to induction Oxygen Delivery Method: Circle System Utilized Preoxygenation: Pre-oxygenation with 100% oxygen Induction Type: IV induction Ventilation: Mask ventilation without difficulty LMA: LMA inserted LMA Size: 4.0 Grade View: Grade I Number of attempts: 1 Placement Confirmation: positive ETCO2 and breath sounds checked- equal and bilateral Tube secured with: Tape Dental Injury: Teeth and Oropharynx as per pre-operative assessment

## 2016-12-19 NOTE — Discharge Instructions (Signed)
Endometrial Ablation Endometrial ablation is a procedure that destroys the thin inner layer of the lining of the uterus (endometrium). This procedure may be done:  To stop heavy periods.  To stop bleeding that is causing anemia.  To control irregular bleeding.  To treat bleeding caused by small tumors (fibroids) in the endometrium.  This procedure is often an alternative to major surgery, such as removal of the uterus and cervix (hysterectomy). As a result of this procedure:  You may not be able to have children. However, if you are premenopausal (you have not gone through menopause): ? You may still have a small chance of getting pregnant. ? You will need to use a reliable method of birth control after the procedure to prevent pregnancy.  You may stop having a menstrual period, or you may have only a small amount of bleeding during your period. Menstruation may return several years after the procedure.  Tell a health care provider about:  Any allergies you have.  All medicines you are taking, including vitamins, herbs, eye drops, creams, and over-the-counter medicines.  Any problems you or family members have had with the use of anesthetic medicines.  Any blood disorders you have.  Any surgeries you have had.  Any medical conditions you have. What are the risks? Generally, this is a safe procedure. However, problems may occur, including:  A hole (perforation) in the uterus or bowel.  Infection of the uterus, bladder, or vagina.  Bleeding.  Damage to other structures or organs.  An air bubble in the lung (air embolus).  Problems with pregnancy after the procedure.  Failure of the procedure.  Decreased ability to diagnose cancer in the endometrium.  What happens before the procedure?  You will have tests of your endometrium to make sure there are no pre-cancerous cells or cancer cells present.  You may have an ultrasound of the uterus.  You may be given  medicines to thin the endometrium.  Ask your health care provider about: ? Changing or stopping your regular medicines. This is especially important if you take diabetes medicines or blood thinners. ? Taking medicines such as aspirin and ibuprofen. These medicines can thin your blood. Do not take these medicines before your procedure if your doctor tells you not to.  Plan to have someone take you home from the hospital or clinic. What happens during the procedure?  You will lie on an exam table with your feet and legs supported as in a pelvic exam.  To lower your risk of infection: ? Your health care team will wash or sanitize their hands and put on germ-free (sterile) gloves. ? Your genital area will be washed with soap.  An IV tube will be inserted into one of your veins.  You will be given a medicine to help you relax (sedative).  A surgical instrument with a light and camera (resectoscope) will be inserted into your vagina and moved into your uterus. This allows your surgeon to see inside your uterus.  Endometrial tissue will be removed using one of the following methods: ? Radiofrequency. This method uses a radiofrequency-alternating electric current to remove the endometrium. ? Cryotherapy. This method uses extreme cold to freeze the endometrium. ? Heated-free liquid. This method uses a heated saltwater (saline) solution to remove the endometrium. ? Microwave. This method uses high-energy microwaves to heat up the endometrium and remove it. ? Thermal balloon. This method involves inserting a catheter with a balloon tip into the uterus. The balloon tip is   filled with heated fluid to remove the endometrium. The procedure may vary among health care providers and hospitals. What happens after the procedure?  Your blood pressure, heart rate, breathing rate, and blood oxygen level will be monitored until the medicines you were given have worn off.  As tissue healing occurs, you may  notice vaginal bleeding for 4-6 weeks after the procedure. You may also experience: ? Cramps. ? Thin, watery vaginal discharge that is light pink or brown in color. ? A need to urinate more frequently than usual. ? Nausea.  Do not drive for 24 hours if you were given a sedative.  Do not have sex or insert anything into your vagina until your health care provider approves. Summary  Endometrial ablation is done to treat the many causes of heavy menstrual bleeding.  The procedure may be done only after medications have been tried to control the bleeding.  Plan to have someone take you home from the hospital or clinic. This information is not intended to replace advice given to you by your health care provider. Make sure you discuss any questions you have with your health care provider. Document Released: 12/02/2003 Document Revised: 02/09/2016 Document Reviewed: 02/09/2016 Elsevier Interactive Patient Education  2017 [employer]  

## 2016-12-19 NOTE — Interval H&P Note (Signed)
History and Physical Interval Note:  12/19/2016 8:08 AM  Kristina Bonilla  has presented today for surgery, with the diagnosis of Menorrhagia Dysmenorrhea Fibroids  The various methods of treatment have been discussed with the patient and family. After consideration of risks, benefits and other options for treatment, the patient has consented to  Procedure(s): Exira (N/A) as a surgical intervention .  The patient's history has been reviewed, patient examined, no change in status, stable for surgery.  I have reviewed the patient's chart and labs.  Questions were answered to the patient's satisfaction.     [name]

## 2016-12-19 NOTE — Anesthesia Preprocedure Evaluation (Signed)
Anesthesia Evaluation  Patient identified by MRN, date of birth, ID band Patient awake    Reviewed: Allergy & Precautions, NPO status , Patient's Chart, lab work & pertinent test results  Airway Mallampati: II  TM Distance: >3 FB Neck ROM: Full    Dental  (+) Teeth Intact, Partial Upper   Pulmonary neg pulmonary ROS,    breath sounds clear to auscultation       Cardiovascular hypertension, Pt. on medications (-) angina Rhythm:Regular Rate:Normal     Neuro/Psych Seizures -, Well Controlled,  negative psych ROS   GI/Hepatic negative GI ROS, Neg liver ROS,   Endo/Other  negative endocrine ROS  Renal/GU negative Renal ROS     Musculoskeletal   Abdominal   Peds  Hematology negative hematology ROS (+)   Anesthesia Other Findings   Reproductive/Obstetrics                             Anesthesia Physical Anesthesia Plan  ASA: II  Anesthesia Plan: General   Post-op Pain Management:    Induction: Intravenous  PONV Risk Score and Plan:   Airway Management Planned: LMA  Additional Equipment:   Intra-op Plan:   Post-operative Plan: Extubation in OR  Informed Consent: I have reviewed the patients History and Physical, chart, labs and discussed the procedure including the risks, benefits and alternatives for the proposed anesthesia with the patient or authorized representative who has indicated his/her understanding and acceptance.     Plan Discussed with:   Anesthesia Plan Comments:         Anesthesia Quick Evaluation

## 2016-12-19 NOTE — Transfer of Care (Signed)
Immediate Anesthesia Transfer of Care Note  Patient: Kristina Bonilla  Procedure(s) Performed: DILATATION & CURETTAGE/HYSTEROSCOPY WITH NOVASURE ENDOMETRIAL  ABLATION (N/A )  Patient Location: PACU  Anesthesia Type:General  Level of Consciousness: awake and patient cooperative  Airway & Oxygen Therapy: Patient Spontanous Breathing and Patient connected to nasal cannula oxygen  Post-op Assessment: Report given to RN, Post -op Vital signs reviewed and stable and Patient moving all extremities  Post vital signs: Reviewed and stable  Last Vitals:  Vitals:   12/19/16 0745 12/19/16 0800  BP: 118/74 117/80  Pulse:    Resp: 17 12  Temp:    SpO2: 98% 99%    Last Pain:  Vitals:   12/19/16 0716  TempSrc: Oral  PainSc: 0-No pain         Complications: No apparent anesthesia complications

## 2016-12-19 NOTE — Op Note (Signed)
Preoperative diagnosis: Menometrorrhagia                                        Dysmenorrhea                                        Endometrial polyp   Postoperative diagnoses: Same as above   Procedure: Hysteroscopy, uterine curettage, endometrial ablation using Novasure  Surgeon: Florian Buff   Anesthesia: Laryngeal mask airway  Findings: The endometrium was normal no polyp was seen. There were no fibroid or other abnormalities.  Description of operation: The patient was taken to the operating room and placed in the supine position. She underwent general anesthesia using the laryngeal mask airway. She was placed in the dorsal lithotomy position and prepped and draped in the usual sterile fashion. A Graves speculum was placed and the anterior cervical lip was grasped with a single-tooth tenaculum. The cervix was dilated serially to allow passage of the hysteroscope. Diagnostic hysteroscopy was performed and was found to be normal. A vigorous uterine curettage was then performed and all tissue sent to pathology for evaluation.  I then proceeded to perform the Novasure endometrial ablation.  The cervical length was 3.0. The uterus sounded to  9.5 cm yielding a net length of 6.5 cm.  The endometrial cavity was 4.7 cm wide. The power was 168 watts.  The total time of therapy was 0 min 55 seconds. The array was evaluated after the procedure and tissue was adherent on all the dimensions of the surface, confirming fundal treatment as well.    All of the equipment worked well throughout the procedure.  The patient was awakened from anesthesia and taken to the recovery room in good stable condition all counts were correct. She received 2 g of Ancef and 30 mg of Toradol preoperatively. She will be discharged from the recovery room and followed up in the office in 1- 2 weeks.  EURE,LUTHER H 12/19/2016 9:22 AM

## 2016-12-19 NOTE — Anesthesia Postprocedure Evaluation (Signed)
Anesthesia Post Note  Patient: Kristina Bonilla  Procedure(s) Performed: DILATATION & CURETTAGE/HYSTEROSCOPY WITH NOVASURE ENDOMETRIAL  ABLATION (N/A )  Patient location during evaluation: PACU Anesthesia Type: General Level of consciousness: awake and alert and patient cooperative Pain management: pain level controlled Vital Signs Assessment: post-procedure vital signs reviewed and stable Respiratory status: spontaneous breathing, nonlabored ventilation and respiratory function stable Cardiovascular status: blood pressure returned to baseline Postop Assessment: no apparent nausea or vomiting Anesthetic complications: no     Last Vitals:  Vitals:   12/19/16 0800 12/19/16 0915  BP: 117/80   Pulse:    Resp: 12   Temp:  (P) 36.9 C  SpO2: 99%     Last Pain:  Vitals:   12/19/16 0915  TempSrc:   PainSc: (P) 8                  Layne Dilauro J

## 2016-12-20 ENCOUNTER — Encounter (HOSPITAL_COMMUNITY): Payer: Self-pay | Admitting: Obstetrics & Gynecology

## 2016-12-26 ENCOUNTER — Encounter: Payer: Self-pay | Admitting: Obstetrics & Gynecology

## 2016-12-26 ENCOUNTER — Ambulatory Visit (INDEPENDENT_AMBULATORY_CARE_PROVIDER_SITE_OTHER): Payer: 59 | Admitting: Obstetrics & Gynecology

## 2016-12-26 VITALS — BP 140/80 | Wt 247.0 lb

## 2016-12-26 DIAGNOSIS — Z9889 Other specified postprocedural states: Secondary | ICD-10-CM

## 2016-12-26 NOTE — Progress Notes (Signed)
  HPI: Patient returns for routine postoperative follow-up having undergone hysteroscopy uterine curettage and endometrial ablation using NovaSure on 12/19/2016.  The patient's immediate postoperative recovery has been unremarkable. Since hospital discharge the patient reports blood-tinged watery discharge and cramping.   Current Outpatient Medications: amLODipine (NORVASC) 10 MG tablet, Take 10 mg by mouth daily., Disp: , Rfl:   furosemide (LASIX) 20 MG tablet, Take 10 mg by mouth 2 (two) times daily., Disp: , Rfl:  ketorolac (TORADOL) 10 MG tablet, Take 1 tablet (10 mg total) every 8 (eight) hours as needed by mouth., Disp: 15 tablet, Rfl: 0 losartan (COZAAR) 50 MG tablet, Take 50 mg daily by mouth., Disp: , Rfl: 3 metoprolol succinate (TOPROL-XL) 50 MG 24 hr tablet, Take 1 tablet (50 mg total) by mouth daily. Take with or immediately following a meal., Disp: 90 tablet, Rfl: 3 HYDROcodone-acetaminophen (NORCO/VICODIN) 5-325 MG tablet, Take 1 tablet every 6 (six) hours as needed by mouth. (Patient not taking: Reported on 12/26/2016), Disp: 15 tablet, Rfl: 0 ondansetron (ZOFRAN ODT) 8 MG disintegrating tablet, Take 1 tablet (8 mg total) every 8 (eight) hours as needed by mouth for nausea or vomiting. (Patient not taking: Reported on 12/26/2016), Disp: 20 tablet, Rfl: 0  No current facility-administered medications for this visit.     Blood pressure 140/80, weight 247 lb (112 kg), last menstrual period 12/17/2016.   Physical Exam: Abdomen is benign Vagina reveals blood-tinged watery discharge The uterus is nontender  Normal post-ablation exam  Diagnostic Tests:   Pathology: Benign  Impression: Status post hysteroscopy D&C endometrial ablation for menometrorrhagia and dysmenorrhea  Plan: No sex until discharge completely stops  Follow up: 1  years  Florian Buff, MD

## 2017-02-13 ENCOUNTER — Encounter: Payer: Self-pay | Admitting: Internal Medicine

## 2017-02-13 ENCOUNTER — Ambulatory Visit (INDEPENDENT_AMBULATORY_CARE_PROVIDER_SITE_OTHER): Payer: 59 | Admitting: Internal Medicine

## 2017-02-13 DIAGNOSIS — E0590 Thyrotoxicosis, unspecified without thyrotoxic crisis or storm: Secondary | ICD-10-CM | POA: Diagnosis not present

## 2017-02-13 LAB — T4, FREE: Free T4: 0.76 ng/dL (ref 0.60–1.60)

## 2017-02-13 LAB — TSH: TSH: 0.37 u[IU]/mL (ref 0.35–4.50)

## 2017-02-13 LAB — T3, FREE: T3, Free: 3.6 pg/mL (ref 2.3–4.2)
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
## 2017-02-13 NOTE — Patient Instructions (Addendum)
Please stop at the lab.  We will schedule a new appointment if the results are abnormal.  Please look up: Christa See - The [employer] diet   Rip Cyd Silence - The [employer] 2 7-day Rescue diet [place]

## 2017-02-20 LAB — THYROGLOBULIN ANTIBODY: Thyroglobulin Ab: 1 IU/mL (ref <=1)

## 2017-02-20 LAB — THYROID PEROXIDASE ANTIBODY

## 2017-02-20 LAB — THYROID STIMULATING IMMUNOGLOBULIN: TSI: 138 % baseline (ref <140)

## 2017-03-12 DIAGNOSIS — N951 Menopausal and female climacteric states: Secondary | ICD-10-CM | POA: Diagnosis not present

## 2017-03-12 DIAGNOSIS — R5383 Other fatigue: Secondary | ICD-10-CM | POA: Diagnosis not present

## 2017-03-12 DIAGNOSIS — I10 Essential (primary) hypertension: Secondary | ICD-10-CM | POA: Diagnosis not present

## 2017-03-12 DIAGNOSIS — R946 Abnormal results of thyroid function studies: Secondary | ICD-10-CM | POA: Diagnosis not present

## 2017-04-16 DIAGNOSIS — H5213 Myopia, bilateral: Secondary | ICD-10-CM | POA: Diagnosis not present

## 2017-04-16 DIAGNOSIS — H524 Presbyopia: Secondary | ICD-10-CM | POA: Diagnosis not present

## 2017-04-17 DIAGNOSIS — I10 Essential (primary) hypertension: Secondary | ICD-10-CM | POA: Diagnosis not present

## 2017-04-17 DIAGNOSIS — R946 Abnormal results of thyroid function studies: Secondary | ICD-10-CM | POA: Diagnosis not present

## 2017-04-17 DIAGNOSIS — R5383 Other fatigue: Secondary | ICD-10-CM | POA: Diagnosis not present

## 2017-04-17 DIAGNOSIS — R739 Hyperglycemia, unspecified: Secondary | ICD-10-CM | POA: Diagnosis not present

## 2017-04-17 DIAGNOSIS — N951 Menopausal and female climacteric states: Secondary | ICD-10-CM | POA: Diagnosis not present

## 2017-07-25 DIAGNOSIS — R5383 Other fatigue: Secondary | ICD-10-CM | POA: Diagnosis not present

## 2017-07-25 DIAGNOSIS — N951 Menopausal and female climacteric states: Secondary | ICD-10-CM | POA: Diagnosis not present

## 2017-07-25 DIAGNOSIS — I10 Essential (primary) hypertension: Secondary | ICD-10-CM | POA: Diagnosis not present

## 2017-07-25 DIAGNOSIS — R739 Hyperglycemia, unspecified: Secondary | ICD-10-CM | POA: Diagnosis not present

## 2017-07-25 DIAGNOSIS — Z0000 Encounter for general adult medical examination without abnormal findings: Secondary | ICD-10-CM | POA: Diagnosis not present

## 2017-07-25 DIAGNOSIS — R946 Abnormal results of thyroid function studies: Secondary | ICD-10-CM | POA: Diagnosis not present

## 2017-11-27 DIAGNOSIS — R5383 Other fatigue: Secondary | ICD-10-CM | POA: Diagnosis not present

## 2017-11-27 DIAGNOSIS — N951 Menopausal and female climacteric states: Secondary | ICD-10-CM | POA: Diagnosis not present

## 2017-11-27 DIAGNOSIS — I10 Essential (primary) hypertension: Secondary | ICD-10-CM | POA: Diagnosis not present

## 2017-12-05 ENCOUNTER — Other Ambulatory Visit (HOSPITAL_COMMUNITY): Payer: Self-pay | Admitting: Internal Medicine

## 2017-12-05 DIAGNOSIS — IMO0002 Reserved for concepts with insufficient information to code with codable children: Secondary | ICD-10-CM

## 2017-12-05 DIAGNOSIS — R229 Localized swelling, mass and lump, unspecified: Principal | ICD-10-CM

## 2017-12-10 ENCOUNTER — Ambulatory Visit (HOSPITAL_COMMUNITY)
Admission: RE | Admit: 2017-12-10 | Discharge: 2017-12-10 | Disposition: A | Discharge status: Home / Self Care | Payer: 59 | Source: Ambulatory Visit | Attending: Internal Medicine | Admitting: Internal Medicine

## 2017-12-10 ENCOUNTER — Ambulatory Visit (HOSPITAL_COMMUNITY): Payer: 59

## 2017-12-10 DIAGNOSIS — R922 Inconclusive mammogram: Secondary | ICD-10-CM | POA: Diagnosis not present

## 2017-12-10 DIAGNOSIS — IMO0002 Reserved for concepts with insufficient information to code with codable children: Secondary | ICD-10-CM

## 2017-12-10 DIAGNOSIS — R229 Localized swelling, mass and lump, unspecified: Secondary | ICD-10-CM | POA: Diagnosis not present

## 2017-12-10 DIAGNOSIS — N6489 Other specified disorders of breast: Secondary | ICD-10-CM | POA: Diagnosis not present

## 2017-12-24 ENCOUNTER — Encounter (HOSPITAL_COMMUNITY): Payer: 59

## 2017-12-26 DIAGNOSIS — R011 Cardiac murmur, unspecified: Secondary | ICD-10-CM | POA: Diagnosis not present

## 2017-12-26 DIAGNOSIS — R002 Palpitations: Secondary | ICD-10-CM | POA: Diagnosis not present

## 2017-12-26 DIAGNOSIS — I10 Essential (primary) hypertension: Secondary | ICD-10-CM | POA: Diagnosis not present

## 2018-01-01 ENCOUNTER — Other Ambulatory Visit (HOSPITAL_COMMUNITY): Payer: Self-pay | Admitting: Nurse Practitioner

## 2018-01-01 DIAGNOSIS — R011 Cardiac murmur, unspecified: Secondary | ICD-10-CM

## 2018-01-03 ENCOUNTER — Other Ambulatory Visit (HOSPITAL_COMMUNITY): Payer: 59

## 2018-01-09 DIAGNOSIS — R002 Palpitations: Secondary | ICD-10-CM | POA: Diagnosis not present

## 2018-01-09 DIAGNOSIS — R011 Cardiac murmur, unspecified: Secondary | ICD-10-CM | POA: Diagnosis not present

## 2018-01-09 DIAGNOSIS — R079 Chest pain, unspecified: Secondary | ICD-10-CM | POA: Diagnosis not present

## 2018-01-10 ENCOUNTER — Ambulatory Visit (HOSPITAL_COMMUNITY)
Admission: RE | Admit: 2018-01-10 | Discharge: 2018-01-10 | Disposition: A | Discharge status: Home / Self Care | Payer: 59 | Source: Ambulatory Visit | Attending: Internal Medicine | Admitting: Internal Medicine

## 2018-01-10 DIAGNOSIS — R011 Cardiac murmur, unspecified: Secondary | ICD-10-CM

## 2018-01-10 NOTE — Progress Notes (Signed)
*  PRELIMINARY RESULTS* Echocardiogram 2D Echocardiogram has been performed.  Kristina Bonilla 01/10/2018, 3:45 PM

## 2018-01-22 ENCOUNTER — Other Ambulatory Visit (HOSPITAL_COMMUNITY): Payer: Self-pay | Admitting: Nurse Practitioner

## 2018-01-22 ENCOUNTER — Ambulatory Visit (HOSPITAL_COMMUNITY)
Admission: RE | Admit: 2018-01-22 | Discharge: 2018-01-22 | Disposition: A | Discharge status: Home / Self Care | Payer: 59 | Source: Ambulatory Visit | Attending: Nurse Practitioner | Admitting: Nurse Practitioner

## 2018-01-22 DIAGNOSIS — R059 Cough, unspecified: Secondary | ICD-10-CM

## 2018-01-22 DIAGNOSIS — R0602 Shortness of breath: Secondary | ICD-10-CM | POA: Diagnosis not present

## 2018-01-22 DIAGNOSIS — R05 Cough: Secondary | ICD-10-CM | POA: Diagnosis not present

## 2018-01-22 DIAGNOSIS — R062 Wheezing: Secondary | ICD-10-CM | POA: Diagnosis not present

## 2018-01-23 ENCOUNTER — Encounter: Payer: Self-pay | Admitting: Cardiovascular Disease

## 2018-02-28 ENCOUNTER — Emergency Department (HOSPITAL_COMMUNITY)
Admission: EM | Admit: 2018-02-28 | Discharge: 2018-02-28 | Disposition: A | Discharge status: Home / Self Care | Payer: 59 | Attending: Emergency Medicine | Admitting: Emergency Medicine

## 2018-02-28 ENCOUNTER — Other Ambulatory Visit: Payer: Self-pay

## 2018-02-28 ENCOUNTER — Encounter (HOSPITAL_COMMUNITY): Payer: Self-pay

## 2018-02-28 DIAGNOSIS — R040 Epistaxis: Secondary | ICD-10-CM | POA: Insufficient documentation

## 2018-02-28 DIAGNOSIS — Z79899 Other long term (current) drug therapy: Secondary | ICD-10-CM | POA: Diagnosis not present

## 2018-02-28 DIAGNOSIS — Z91040 Latex allergy status: Secondary | ICD-10-CM | POA: Diagnosis not present

## 2018-02-28 MED ORDER — PHENYLEPHRINE HCL 0.5 % NA SOLN
2.0000 [drp] | Freq: Once | NASAL | Status: AC
  Administered 2018-02-28: 2 [drp] via NASAL
  Filled 2018-02-28: qty 15

## 2018-02-28 MED ORDER — LIDOCAINE-EPINEPHRINE-TETRACAINE (LET) SOLUTION
3.0000 mL | Freq: Once | NASAL | Status: AC
  Administered 2018-02-28: 3 mL via TOPICAL
  Filled 2018-02-28: qty 3

## 2018-02-28 MED ORDER — SILVER NITRATE-POT NITRATE 75-25 % EX MISC
CUTANEOUS | Status: AC
  Filled 2018-02-28: qty 5

## 2018-02-28 NOTE — ED Notes (Signed)
Pt ambulatory to waiting room. Pt verbalized understanding of discharge instructions.   

## 2018-02-28 NOTE — ED Triage Notes (Signed)
Pt states she has had a nosebleed off and on for 3 days
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
## 2018-03-04 DIAGNOSIS — R946 Abnormal results of thyroid function studies: Secondary | ICD-10-CM | POA: Diagnosis not present

## 2018-03-04 DIAGNOSIS — I10 Essential (primary) hypertension: Secondary | ICD-10-CM | POA: Diagnosis not present

## 2018-03-04 DIAGNOSIS — R739 Hyperglycemia, unspecified: Secondary | ICD-10-CM | POA: Diagnosis not present

## 2018-03-04 DIAGNOSIS — R5383 Other fatigue: Secondary | ICD-10-CM | POA: Diagnosis not present

## 2018-03-04 DIAGNOSIS — E559 Vitamin D deficiency, unspecified: Secondary | ICD-10-CM | POA: Diagnosis not present

## 2018-03-04 DIAGNOSIS — R6882 Decreased libido: Secondary | ICD-10-CM | POA: Diagnosis not present

## 2018-03-04 DIAGNOSIS — N951 Menopausal and female climacteric states: Secondary | ICD-10-CM | POA: Diagnosis not present

## 2018-03-12 DIAGNOSIS — I10 Essential (primary) hypertension: Secondary | ICD-10-CM | POA: Diagnosis not present

## 2018-03-12 DIAGNOSIS — R05 Cough: Secondary | ICD-10-CM | POA: Diagnosis not present

## 2018-07-17 ENCOUNTER — Emergency Department (HOSPITAL_COMMUNITY): Payer: 59

## 2018-07-17 ENCOUNTER — Other Ambulatory Visit: Payer: Self-pay

## 2018-07-17 ENCOUNTER — Emergency Department (HOSPITAL_COMMUNITY)
Admission: EM | Admit: 2018-07-17 | Discharge: 2018-07-17 | Disposition: A | Discharge status: Home / Self Care | Payer: 59 | Attending: Emergency Medicine | Admitting: Emergency Medicine

## 2018-07-17 ENCOUNTER — Encounter (HOSPITAL_COMMUNITY): Payer: Self-pay

## 2018-07-17 DIAGNOSIS — I10 Essential (primary) hypertension: Secondary | ICD-10-CM | POA: Diagnosis not present

## 2018-07-17 DIAGNOSIS — Z79899 Other long term (current) drug therapy: Secondary | ICD-10-CM | POA: Insufficient documentation

## 2018-07-17 DIAGNOSIS — R51 Headache: Secondary | ICD-10-CM | POA: Insufficient documentation

## 2018-07-17 DIAGNOSIS — R110 Nausea: Secondary | ICD-10-CM | POA: Insufficient documentation

## 2018-07-17 DIAGNOSIS — R519 Headache, unspecified: Secondary | ICD-10-CM

## 2018-07-17 LAB — CBC WITH DIFFERENTIAL/PLATELET
Abs Immature Granulocytes: 0.02 10*3/uL (ref 0.00–0.07)
Basophils Absolute: 0 10*3/uL (ref 0.0–0.1)
Basophils Relative: 0 %
Eosinophils Absolute: 0.1 10*3/uL (ref 0.0–0.5)
Eosinophils Relative: 1 %
HCT: 41.4 % (ref 36.0–46.0)
Hemoglobin: 12.9 g/dL (ref 12.0–15.0)
Immature Granulocytes: 0 %
Lymphocytes Relative: 24 %
Lymphs Abs: 2.2 10*3/uL (ref 0.7–4.0)
MCH: 26.8 pg (ref 26.0–34.0)
MCHC: 31.2 g/dL (ref 30.0–36.0)
MCV: 85.9 fL (ref 80.0–100.0)
Monocytes Absolute: 0.7 10*3/uL (ref 0.1–1.0)
Monocytes Relative: 7 %
Neutro Abs: 6 10*3/uL (ref 1.7–7.7)
Neutrophils Relative %: 68 %
Platelets: 311 10*3/uL (ref 150–400)
RBC: 4.82 MIL/uL (ref 3.87–5.11)
RDW: 15 % (ref 11.5–15.5)
WBC: 9 10*3/uL (ref 4.0–10.5)
nRBC: 0 % (ref 0.0–0.2)

## 2018-07-17 LAB — COMPREHENSIVE METABOLIC PANEL
ALT: 14 U/L (ref 0–44)
AST: 17 U/L (ref 15–41)
Albumin: 3.9 g/dL (ref 3.5–5.0)
Alkaline Phosphatase: 89 U/L (ref 38–126)
Anion gap: 11 (ref 5–15)
BUN: 10 mg/dL (ref 6–20)
CO2: 26 mmol/L (ref 22–32)
Calcium: 9.3 mg/dL (ref 8.9–10.3)
Chloride: 101 mmol/L (ref 98–111)
Creatinine, Ser: 0.65 mg/dL (ref 0.44–1.00)
GFR calc Af Amer: >60 mL/min (ref >60)
GFR calc non Af Amer: >60 mL/min (ref >60)
Glucose, Bld: 92 mg/dL (ref 70–99)
Potassium: 3.6 mmol/L (ref 3.5–5.1)
Sodium: 138 mmol/L (ref 135–145)
Total Bilirubin: 0.3 mg/dL (ref 0.3–1.2)
Total Protein: 8.5 g/dL — ABNORMAL HIGH (ref 6.5–8.1)

## 2018-07-17 LAB — HCG, QUANTITATIVE, PREGNANCY: hCG, Beta Chain, Quant, S: <1 m[IU]/mL (ref <5)

## 2018-07-17 LAB — LIPASE, BLOOD: Lipase: 32 U/L (ref 11–51)

## 2018-07-17 LAB — TROPONIN I: Troponin I: <0.03 ng/mL (ref <0.03)

## 2018-07-17 MED ORDER — DIPHENHYDRAMINE HCL 50 MG/ML IJ SOLN
25.0000 mg | Freq: Once | INTRAMUSCULAR | Status: AC
  Administered 2018-07-17: 25 mg via INTRAVENOUS
  Filled 2018-07-17: qty 1

## 2018-07-17 MED ORDER — CLONIDINE HCL 0.2 MG PO TABS
0.2000 mg | ORAL_TABLET | Freq: Once | ORAL | Status: AC
  Administered 2018-07-17: 0.2 mg via ORAL
  Filled 2018-07-17: qty 1

## 2018-07-17 MED ORDER — LABETALOL HCL 5 MG/ML IV SOLN
10.0000 mg | Freq: Once | INTRAVENOUS | Status: AC
  Administered 2018-07-17: 10 mg via INTRAVENOUS
  Filled 2018-07-17: qty 4

## 2018-07-17 MED ORDER — DEXAMETHASONE SODIUM PHOSPHATE 10 MG/ML IJ SOLN
10.0000 mg | Freq: Once | INTRAMUSCULAR | Status: AC
  Administered 2018-07-17: 10 mg via INTRAVENOUS
  Filled 2018-07-17: qty 1

## 2018-07-17 MED ORDER — MORPHINE SULFATE (PF) 2 MG/ML IV SOLN
2.0000 mg | Freq: Once | INTRAVENOUS | Status: AC
  Administered 2018-07-17: 2 mg via INTRAVENOUS
  Filled 2018-07-17: qty 1

## 2018-07-17 MED ORDER — HYDROCHLOROTHIAZIDE 25 MG PO TABS: 25.0000 mg | ORAL_TABLET | Freq: Every day | ORAL | 0 refills | Status: DC

## 2018-07-17 NOTE — Discharge Instructions (Addendum)
I recommend adding hydrochlorothiazide (fluid pill) to better control your blood pressure. Take this in addition to your amlodipine.  Plan to see your doctor for a recheck next week as you have planned.
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
## 2018-07-17 NOTE — ED Notes (Signed)
Patient transported to CT 

## 2018-07-17 NOTE — ED Triage Notes (Signed)
Pt reports headache on r side of head and neck x 2 weeks.  Reports bp elevated today and tingling to r side of face that started at 0950.  Denies any numbness, tingling, or weakness in extremities.  Reports  Nausea.  EDP notified.

## 2018-07-19 ENCOUNTER — Emergency Department (HOSPITAL_COMMUNITY): Payer: 59

## 2018-07-19 ENCOUNTER — Encounter (HOSPITAL_COMMUNITY): Payer: Self-pay | Admitting: Emergency Medicine

## 2018-07-19 ENCOUNTER — Emergency Department (HOSPITAL_COMMUNITY)
Admission: EM | Admit: 2018-07-19 | Discharge: 2018-07-19 | Disposition: A | Discharge status: Home / Self Care | Payer: 59 | Attending: Emergency Medicine | Admitting: Emergency Medicine

## 2018-07-19 ENCOUNTER — Other Ambulatory Visit: Payer: Self-pay

## 2018-07-19 DIAGNOSIS — M5030 Other cervical disc degeneration, unspecified cervical region: Secondary | ICD-10-CM | POA: Diagnosis not present

## 2018-07-19 DIAGNOSIS — R51 Headache: Secondary | ICD-10-CM | POA: Insufficient documentation

## 2018-07-19 DIAGNOSIS — M542 Cervicalgia: Secondary | ICD-10-CM | POA: Diagnosis not present

## 2018-07-19 DIAGNOSIS — I10 Essential (primary) hypertension: Secondary | ICD-10-CM | POA: Insufficient documentation

## 2018-07-19 DIAGNOSIS — M50321 Other cervical disc degeneration at C4-C5 level: Secondary | ICD-10-CM | POA: Diagnosis not present

## 2018-07-19 DIAGNOSIS — Z79899 Other long term (current) drug therapy: Secondary | ICD-10-CM | POA: Insufficient documentation

## 2018-07-19 DIAGNOSIS — M62838 Other muscle spasm: Secondary | ICD-10-CM | POA: Diagnosis not present

## 2018-07-19 DIAGNOSIS — M50323 Other cervical disc degeneration at C6-C7 level: Secondary | ICD-10-CM | POA: Diagnosis not present

## 2018-07-19 HISTORY — DX: Migraine, unspecified, not intractable, without status migrainosus: G43.909

## 2018-07-19 LAB — I-STAT CHEM 8, ED
BUN: 13 mg/dL (ref 6–20)
Calcium, Ion: 1.19 mmol/L (ref 1.15–1.40)
Chloride: 99 mmol/L (ref 98–111)
Creatinine, Ser: 0.7 mg/dL (ref 0.44–1.00)
Glucose, Bld: 96 mg/dL (ref 70–99)
HCT: 45 % (ref 36.0–46.0)
Hemoglobin: 15.3 g/dL — ABNORMAL HIGH (ref 12.0–15.0)
Potassium: 3.4 mmol/L — ABNORMAL LOW (ref 3.5–5.1)
Sodium: 137 mmol/L (ref 135–145)
TCO2: 26 mmol/L (ref 22–32)

## 2018-07-19 MED ORDER — METHOCARBAMOL 500 MG PO TABS: 1000.0000 mg | ORAL_TABLET | Freq: Four times a day (QID) | ORAL | 0 refills | Status: DC | PRN

## 2018-07-19 MED ORDER — METHOCARBAMOL 500 MG PO TABS
1000.0000 mg | ORAL_TABLET | Freq: Once | ORAL | Status: AC
  Administered 2018-07-19: 1000 mg via ORAL
  Filled 2018-07-19: qty 2

## 2018-07-19 MED ORDER — HYDROCODONE-ACETAMINOPHEN 5-325 MG PO TABS
1.0000 | ORAL_TABLET | Freq: Once | ORAL | Status: AC
  Administered 2018-07-19: 1 via ORAL
  Filled 2018-07-19: qty 1

## 2018-07-19 MED ORDER — HYDROCODONE-ACETAMINOPHEN 5-325 MG PO TABS: ORAL_TABLET | ORAL | 0 refills | Status: DC

## 2018-07-19 MED ORDER — POTASSIUM CHLORIDE CRYS ER 20 MEQ PO TBCR
20.0000 meq | EXTENDED_RELEASE_TABLET | Freq: Once | ORAL | Status: AC
  Administered 2018-07-19: 20 meq via ORAL
  Filled 2018-07-19: qty 1

## 2018-07-19 NOTE — Discharge Instructions (Signed)
Take the prescriptions as directed.  Apply moist heat or ice to the area(s) of discomfort, for 15 minutes at a time, several times per day for the next few days.  Do not fall asleep on a heating or ice pack. Take your usual prescriptions as previously directed.  Take your blood pressure only once per day, either in the morning approximately 1 hour after you take your medicine(s) or in the evening before you go to bed.  Always sit quietly for at least 15 minutes before taking your blood pressure.  Keep a diary of your blood pressures to show your doctor at your follow up office visit.  Call your regular medical doctor on Monday morning to schedule a follow up appointment this week.  Return to the Emergency Department immediately sooner if worsening.
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
## 2018-07-19 NOTE — ED Triage Notes (Signed)
Pt states that she was here on the 11th for the same thing she is not feeling any better she still has a headache and her bp is still up

## 2018-07-19 NOTE — ED Notes (Signed)
Pt here several days ago   New meds given   Has taken meds   BP continues to be high

## 2018-07-21 DIAGNOSIS — R946 Abnormal results of thyroid function studies: Secondary | ICD-10-CM | POA: Diagnosis not present

## 2018-07-21 DIAGNOSIS — M542 Cervicalgia: Secondary | ICD-10-CM | POA: Diagnosis not present

## 2018-07-21 DIAGNOSIS — R739 Hyperglycemia, unspecified: Secondary | ICD-10-CM | POA: Diagnosis not present

## 2018-07-21 DIAGNOSIS — N951 Menopausal and female climacteric states: Secondary | ICD-10-CM | POA: Diagnosis not present

## 2018-07-21 DIAGNOSIS — R5383 Other fatigue: Secondary | ICD-10-CM | POA: Diagnosis not present

## 2018-07-21 DIAGNOSIS — E559 Vitamin D deficiency, unspecified: Secondary | ICD-10-CM | POA: Diagnosis not present

## 2018-07-21 DIAGNOSIS — I10 Essential (primary) hypertension: Secondary | ICD-10-CM | POA: Diagnosis not present

## 2018-07-30 DIAGNOSIS — N951 Menopausal and female climacteric states: Secondary | ICD-10-CM | POA: Diagnosis not present

## 2018-07-30 DIAGNOSIS — E559 Vitamin D deficiency, unspecified: Secondary | ICD-10-CM | POA: Diagnosis not present

## 2018-08-27 DIAGNOSIS — E559 Vitamin D deficiency, unspecified: Secondary | ICD-10-CM | POA: Diagnosis not present

## 2018-08-27 DIAGNOSIS — Z0000 Encounter for general adult medical examination without abnormal findings: Secondary | ICD-10-CM | POA: Diagnosis not present

## 2018-08-27 DIAGNOSIS — N951 Menopausal and female climacteric states: Secondary | ICD-10-CM | POA: Diagnosis not present

## 2018-08-27 DIAGNOSIS — I10 Essential (primary) hypertension: Secondary | ICD-10-CM | POA: Diagnosis not present

## 2018-08-27 MED FILL — HYDROCHLOROTHIAZIDE 25 MG T: 25 | 90 days supply | Qty: 90 | Fill #0

## 2018-08-27 MED FILL — LOSARTAN POTASSIUM 50 MG TA: 50 | 90 days supply | Qty: 90 | Fill #0

## 2018-08-27 MED FILL — PROGESTERONE MICRONIZED 200: 200 | 30 days supply | Qty: 30 | Fill #0

## 2018-08-27 MED FILL — AMLODIPINE BESYLATE 10 MG T: 10 | 90 days supply | Qty: 90 | Fill #0

## 2018-09-01 DIAGNOSIS — H524 Presbyopia: Secondary | ICD-10-CM | POA: Diagnosis not present

## 2018-09-02 ENCOUNTER — Encounter (INDEPENDENT_AMBULATORY_CARE_PROVIDER_SITE_OTHER): Payer: Self-pay | Admitting: *Deleted

## 2018-10-28 ENCOUNTER — Ambulatory Visit (INDEPENDENT_AMBULATORY_CARE_PROVIDER_SITE_OTHER): Payer: 59 | Admitting: Internal Medicine

## 2018-12-01 ENCOUNTER — Other Ambulatory Visit (HOSPITAL_COMMUNITY): Payer: Self-pay | Admitting: Internal Medicine

## 2018-12-01 DIAGNOSIS — Z1231 Encounter for screening mammogram for malignant neoplasm of breast: Secondary | ICD-10-CM

## 2018-12-12 ENCOUNTER — Ambulatory Visit (HOSPITAL_COMMUNITY)
Admission: RE | Admit: 2018-12-12 | Discharge: 2018-12-12 | Disposition: A | Discharge status: Home / Self Care | Payer: 59 | Source: Ambulatory Visit | Attending: Internal Medicine | Admitting: Internal Medicine

## 2018-12-12 ENCOUNTER — Other Ambulatory Visit: Payer: Self-pay

## 2018-12-12 DIAGNOSIS — Z1231 Encounter for screening mammogram for malignant neoplasm of breast: Secondary | ICD-10-CM | POA: Insufficient documentation

## 2019-01-04 IMAGING — MG DIGITAL DIAGNOSTIC BILATERAL MAMMOGRAM WITH TOMO AND CAD
6 of 10 series · 6 of 30 positions shown · non-contrast
Comparison: Previous exam(s).

CLINICAL DATA: Follow-up of a probable benign left axillary lymph
node. Patient complains of a superficial palpable abnormality in the
left axilla that has been stable and present for 1 year.

EXAM:
DIGITAL DIAGNOSTIC BILATERAL MAMMOGRAM WITH CAD AND TOMO
ULTRASOUND LEFT BREAST

[R MLO synth-2D]
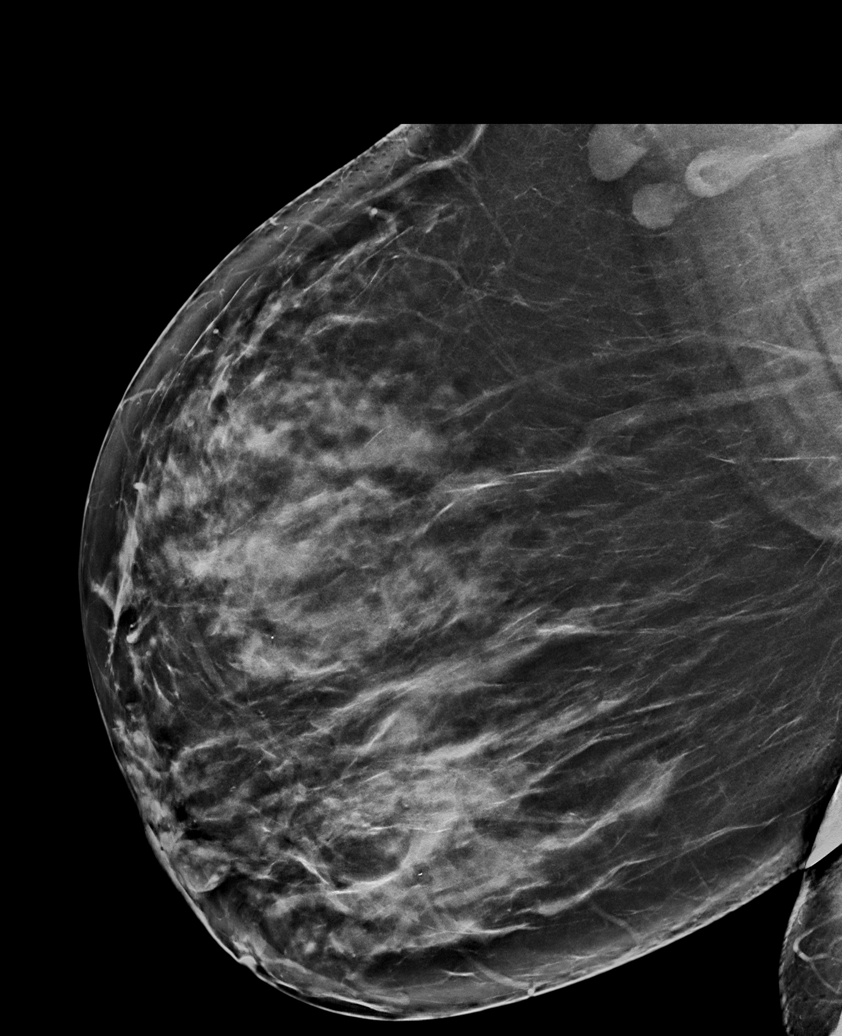

[L CC synth-2D]
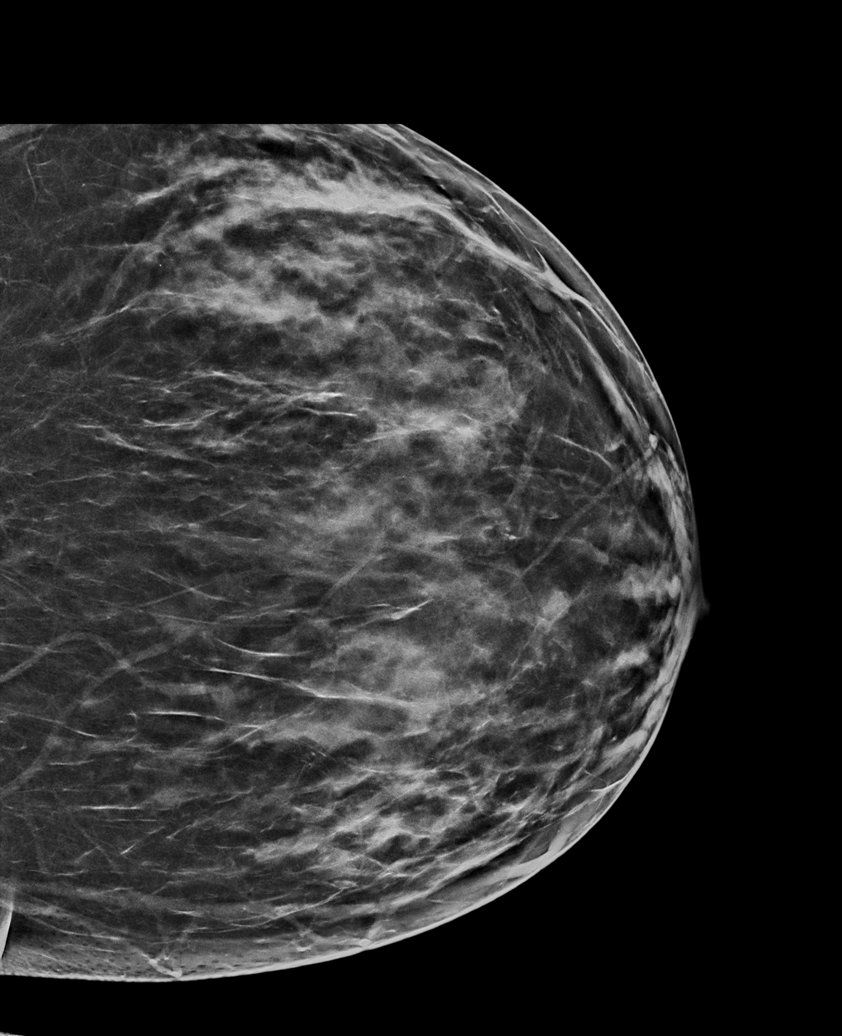

[L MLO synth-2D (1 of 2)]
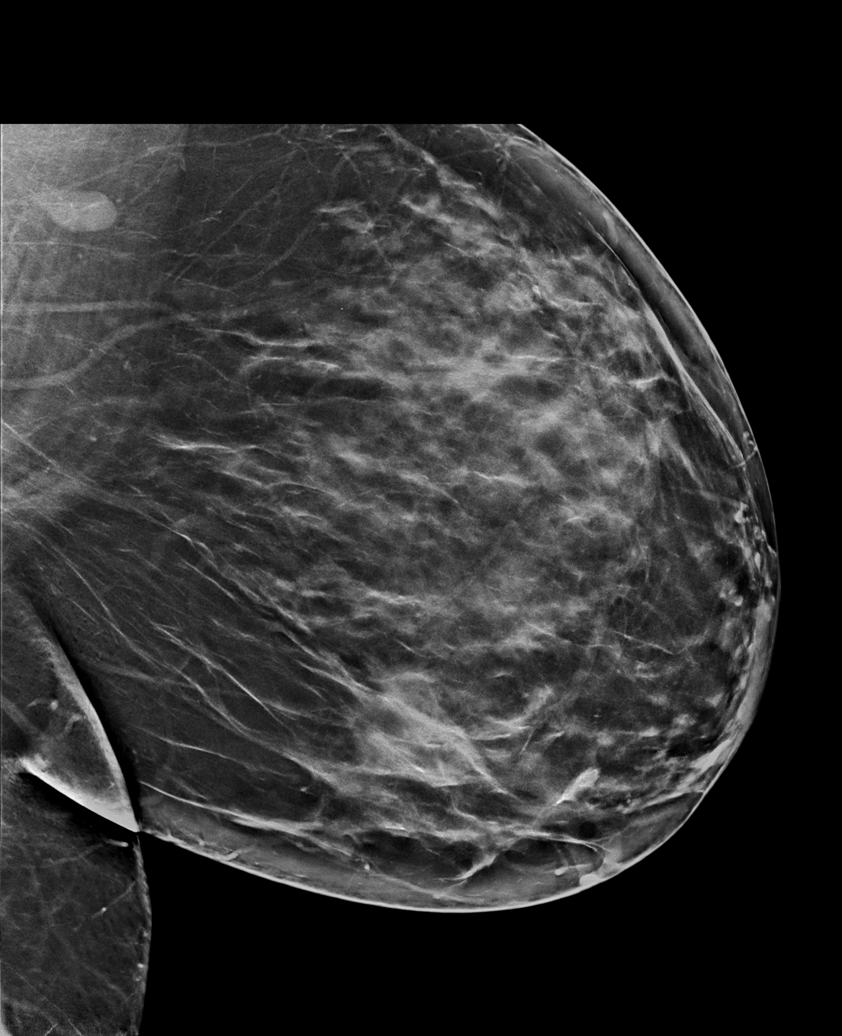

[R CC synth-2D]
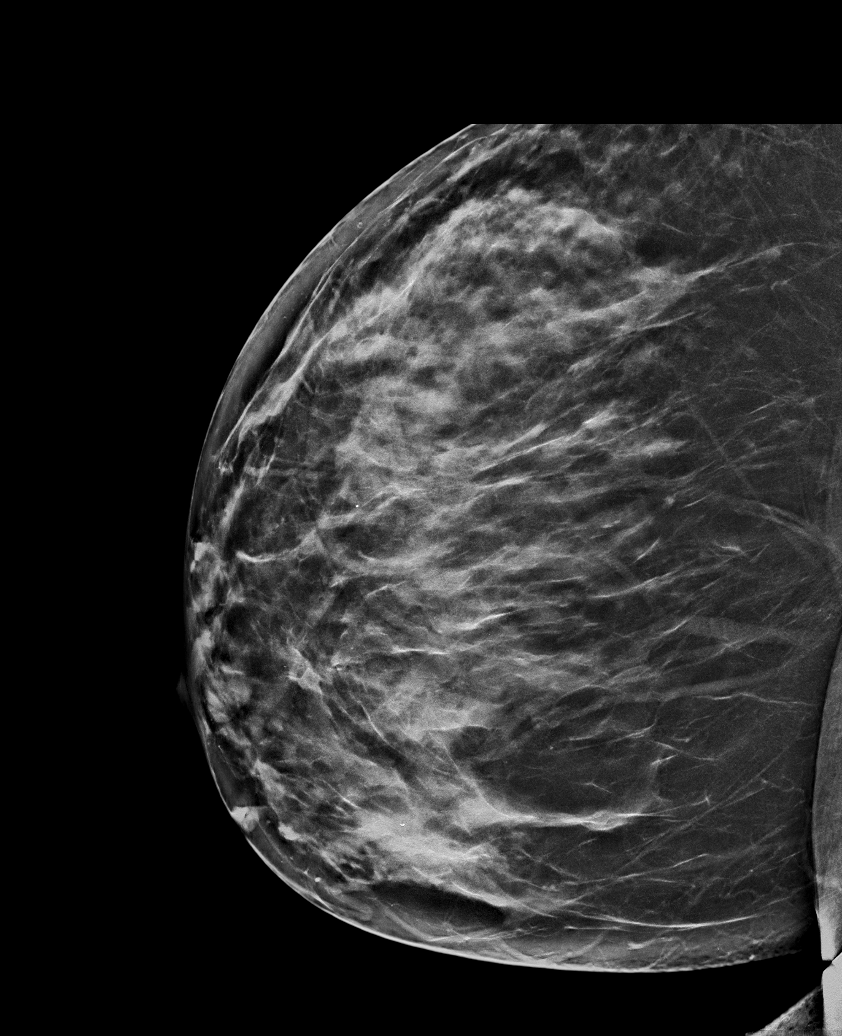

[L MLO synth-2D (2 of 2)]
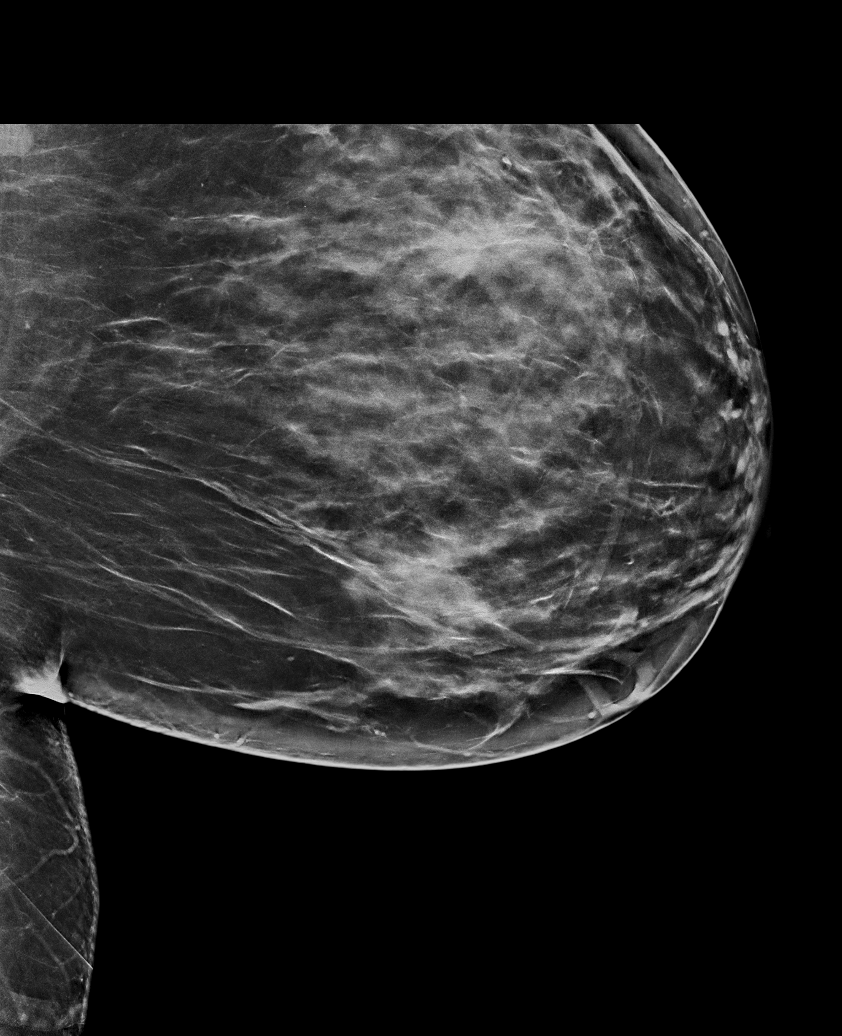

[R CC tomo · tomo slice 43/84.0]
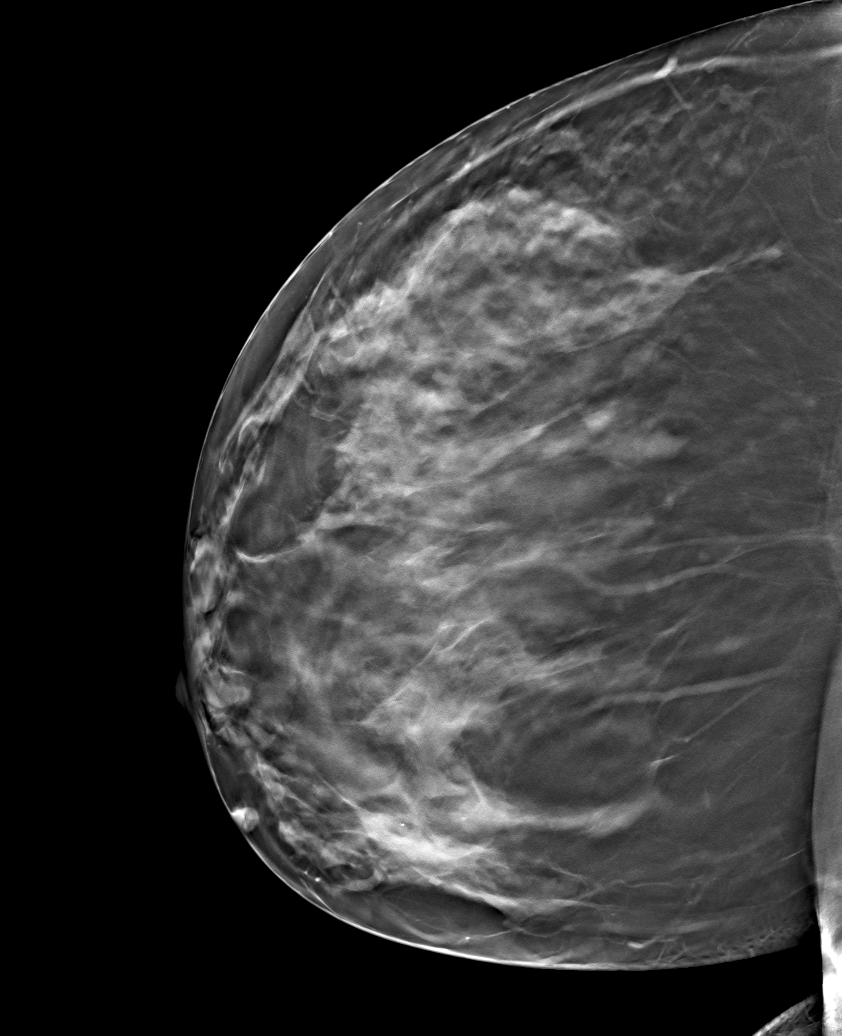

[6 of 30 positions shown; findings below may reference images not displayed]

ACR Breast Density Category c: The breast tissue is heterogeneously
dense, which may obscure small masses.
FINDINGS: No suspicious mass, malignant type microcalcifications or distortion
detected in either breast.

Mammographic images were processed with CAD.

On physical exam, I palpate a superficial BB sized mass in the left
axilla.

Targeted ultrasound is performed, showing a complex cyst in the skin
of the left axilla with a tract to the skin surface measuring 6 mm.
It is consistent with a sebaceous cyst. There is a left axillary
lymph node with the cortex measuring 6 mm. It is stable compared to
the prior ultrasound dated 12/07/2014.
IMPRESSION: No evidence of malignancy in either breast.

RECOMMENDATION:
Bilateral screening mammogram in 1 year is recommended.

I have discussed the findings and recommendations with the patient.
Results were also provided in writing at the conclusion of the
visit. If applicable, a reminder letter will be sent to the patient
regarding the next appointment.

BI-RADS CATEGORY  2: Benign.

## 2019-03-05 ENCOUNTER — Ambulatory Visit: Payer: 59

## 2019-03-10 ENCOUNTER — Ambulatory Visit: Payer: 59 | Attending: Internal Medicine

## 2019-03-10 ENCOUNTER — Other Ambulatory Visit: Payer: Self-pay

## 2019-03-10 DIAGNOSIS — Z20822 Contact with and (suspected) exposure to covid-19: Secondary | ICD-10-CM | POA: Diagnosis not present

## 2019-03-11 LAB — NOVEL CORONAVIRUS, NAA: SARS-CoV-2, NAA: NOT DETECTED

## 2019-04-09 MED FILL — LOSARTAN POTASSIUM 50 MG TA: 50 | 90 days supply | Qty: 90 | Fill #1

## 2019-04-09 MED FILL — AMLODIPINE BESYLATE 10 MG T: 10 | 90 days supply | Qty: 90 | Fill #1

## 2019-04-09 MED FILL — HYDROCHLOROTHIAZIDE 25 MG T: 25 | 90 days supply | Qty: 90 | Fill #1

## 2019-08-11 ENCOUNTER — Encounter (INDEPENDENT_AMBULATORY_CARE_PROVIDER_SITE_OTHER): Payer: Self-pay | Admitting: Nurse Practitioner

## 2019-08-11 ENCOUNTER — Ambulatory Visit (INDEPENDENT_AMBULATORY_CARE_PROVIDER_SITE_OTHER): Payer: 59 | Admitting: Nurse Practitioner

## 2019-08-11 ENCOUNTER — Other Ambulatory Visit: Payer: Self-pay

## 2019-08-11 VITALS — BP 128/84 | HR 89 | Temp 96.3°F | Ht 61.0 in | Wt 252.6 lb

## 2019-08-11 DIAGNOSIS — M542 Cervicalgia: Secondary | ICD-10-CM

## 2019-08-11 DIAGNOSIS — I10 Essential (primary) hypertension: Secondary | ICD-10-CM

## 2019-08-11 DIAGNOSIS — L509 Urticaria, unspecified: Secondary | ICD-10-CM | POA: Diagnosis not present

## 2019-08-11 DIAGNOSIS — Z6841 Body Mass Index (BMI) 40.0 and over, adult: Secondary | ICD-10-CM | POA: Diagnosis not present

## 2019-08-11 MED ORDER — HYDROXYZINE HCL 10 MG PO TABS: 10.0000 mg | ORAL_TABLET | Freq: Three times a day (TID) | ORAL | 2 refills | Status: DC | PRN
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
## 2019-09-08 ENCOUNTER — Ambulatory Visit
Admission: EM | Admit: 2019-09-08 | Discharge: 2019-09-08 | Disposition: A | Discharge status: Home / Self Care | Payer: 59 | Attending: Emergency Medicine | Admitting: Emergency Medicine

## 2019-09-08 ENCOUNTER — Other Ambulatory Visit: Payer: Self-pay

## 2019-09-08 DIAGNOSIS — R21 Rash and other nonspecific skin eruption: Secondary | ICD-10-CM | POA: Diagnosis not present

## 2019-09-08 DIAGNOSIS — L299 Pruritus, unspecified: Secondary | ICD-10-CM

## 2019-09-08 MED ORDER — PREDNISONE 20 MG PO TABS: 20.0000 mg | ORAL_TABLET | Freq: Two times a day (BID) | ORAL | 0 refills | Status: AC

## 2019-09-08 NOTE — ED Triage Notes (Signed)
Pt has recurrent rash that comes and goes, last episode this am, pt also  has new irritation or possible allergic reaction to cleaning products from sitting on toilet
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
## 2019-09-08 NOTE — Discharge Instructions (Signed)
Prescribed short course of prednisone for possible allergic reaction Continue with hydroxyzine as prescribed Follow up with PCP next week for recheck Return or go to the ER if you have any new or worsening symptoms such as fever, chills, nausea, vomiting, redness, swelling, discharge, if symptoms do not improve with medications, etc..Marland Kitchen

## 2019-09-10 ENCOUNTER — Ambulatory Visit (INDEPENDENT_AMBULATORY_CARE_PROVIDER_SITE_OTHER): Payer: 59 | Admitting: Nurse Practitioner

## 2019-09-10 ENCOUNTER — Other Ambulatory Visit (INDEPENDENT_AMBULATORY_CARE_PROVIDER_SITE_OTHER): Payer: Self-pay | Admitting: Nurse Practitioner

## 2019-09-10 ENCOUNTER — Telehealth (INDEPENDENT_AMBULATORY_CARE_PROVIDER_SITE_OTHER): Payer: Self-pay | Admitting: Nurse Practitioner

## 2019-09-10 ENCOUNTER — Encounter (INDEPENDENT_AMBULATORY_CARE_PROVIDER_SITE_OTHER): Payer: Self-pay | Admitting: Nurse Practitioner

## 2019-09-10 ENCOUNTER — Other Ambulatory Visit: Payer: Self-pay

## 2019-09-10 ENCOUNTER — Other Ambulatory Visit (INDEPENDENT_AMBULATORY_CARE_PROVIDER_SITE_OTHER): Payer: Self-pay | Admitting: Internal Medicine

## 2019-09-10 VITALS — BP 150/90 | HR 104 | Temp 96.4°F | Ht 61.0 in | Wt 257.2 lb

## 2019-09-10 DIAGNOSIS — Z6841 Body Mass Index (BMI) 40.0 and over, adult: Secondary | ICD-10-CM | POA: Diagnosis not present

## 2019-09-10 DIAGNOSIS — E559 Vitamin D deficiency, unspecified: Secondary | ICD-10-CM

## 2019-09-10 DIAGNOSIS — Z13220 Encounter for screening for lipoid disorders: Secondary | ICD-10-CM | POA: Diagnosis not present

## 2019-09-10 DIAGNOSIS — M542 Cervicalgia: Secondary | ICD-10-CM

## 2019-09-10 DIAGNOSIS — Z131 Encounter for screening for diabetes mellitus: Secondary | ICD-10-CM | POA: Diagnosis not present

## 2019-09-10 DIAGNOSIS — Z1329 Encounter for screening for other suspected endocrine disorder: Secondary | ICD-10-CM

## 2019-09-10 DIAGNOSIS — L509 Urticaria, unspecified: Secondary | ICD-10-CM

## 2019-09-10 DIAGNOSIS — I10 Essential (primary) hypertension: Secondary | ICD-10-CM

## 2019-09-10 MED ORDER — METOPROLOL SUCCINATE ER 50 MG PO TB24: 100.0000 mg | ORAL_TABLET | Freq: Every day | ORAL | 0 refills | Status: DC

## 2019-09-10 MED FILL — HYDROCHLOROTHIAZIDE 25 MG T: 25 | 90 days supply | Qty: 90 | Fill #0

## 2019-09-10 MED FILL — METOPROLOL SUCCINATE ER 50: 50 | 15 days supply | Qty: 30 | Fill #0

## 2019-09-10 MED FILL — AMLODIPINE BESYLATE 10 MG T: 10 | 90 days supply | Qty: 90 | Fill #0

## 2019-09-10 NOTE — Patient Instructions (Addendum)
Allergy and [place]: [phone]. Please call them to set up  appt. I will also resend referral today.  [facility]:  [phone]. Please call them to set up appt. I will also resend the referral today.  Hydrocortisone get OTC apply twice day as needed to area of concern.  If you feel unwell check your pulse the same way we practiced in the office. If your pulse rate is <55bpm return to the 50mg  of metoprolol per day and notify our office.

## 2019-09-10 NOTE — Telephone Encounter (Signed)
Patient is asking that you call back and let her know the phone numbers to the two offices we referred her to earlier today. She wants to make sure the phone numbers match what I provided to her. Thank you.

## 2019-09-10 NOTE — Telephone Encounter (Signed)
Okay, will do
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
## 2019-09-11 LAB — COMPLETE METABOLIC PANEL WITH GFR
AG Ratio: 1 (calc) (ref 1.0–2.5)
ALT: 15 U/L (ref 6–29)
AST: 17 U/L (ref 10–35)
Albumin: 4 g/dL (ref 3.6–5.1)
Alkaline phosphatase (APISO): 102 U/L (ref 37–153)
BUN: 13 mg/dL (ref 7–25)
CO2: 31 mmol/L (ref 20–32)
Calcium: 10.2 mg/dL (ref 8.6–10.4)
Chloride: 101 mmol/L (ref 98–110)
Creat: 0.68 mg/dL (ref 0.50–1.05)
GFR, Est African American: 117 mL/min/{1.73_m2} (ref >=60)
GFR, Est Non African American: 101 mL/min/{1.73_m2} (ref >=60)
Globulin: 4.2 g/dL (calc) — ABNORMAL HIGH (ref 1.9–3.7)
Glucose, Bld: 112 mg/dL — ABNORMAL HIGH (ref 65–99)
Potassium: 4.7 mmol/L (ref 3.5–5.3)
Sodium: 140 mmol/L (ref 135–146)
Total Bilirubin: 0.3 mg/dL (ref 0.2–1.2)
Total Protein: 8.2 g/dL — ABNORMAL HIGH (ref 6.1–8.1)

## 2019-09-11 LAB — CBC
HCT: 41.3 % (ref 35.0–45.0)
Hemoglobin: 13.7 g/dL (ref 11.7–15.5)
MCH: 28.5 pg (ref 27.0–33.0)
MCHC: 33.2 g/dL (ref 32.0–36.0)
MCV: 85.9 fL (ref 80.0–100.0)
MPV: 11.3 fL (ref 7.5–12.5)
Platelets: 341 10*3/uL (ref 140–400)
RBC: 4.81 10*6/uL (ref 3.80–5.10)
RDW: 13.3 % (ref 11.0–15.0)
WBC: 21.3 10*3/uL — ABNORMAL HIGH (ref 3.8–10.8)

## 2019-09-11 LAB — LIPID PANEL
Cholesterol: 152 mg/dL (ref <200)
HDL: 54 mg/dL (ref >=50)
LDL Cholesterol (Calc): 82 mg/dL (calc)
Non-HDL Cholesterol (Calc): 98 mg/dL (calc) (ref <130)
Total CHOL/HDL Ratio: 2.8 (calc) (ref <5.0)
Triglycerides: 76 mg/dL (ref <150)

## 2019-09-11 LAB — HEMOGLOBIN A1C
Hgb A1c MFr Bld: 6 % of total Hgb — ABNORMAL HIGH (ref <5.7)
Mean Plasma Glucose: 126 (calc)
eAG (mmol/L): 7 (calc)

## 2019-09-11 LAB — VITAMIN D 25 HYDROXY (VIT D DEFICIENCY, FRACTURES): Vit D, 25-Hydroxy: 14 ng/mL — ABNORMAL LOW (ref 30–100)

## 2019-09-11 LAB — TSH: TSH: 0.08 mIU/L — ABNORMAL LOW

## 2019-09-15 ENCOUNTER — Other Ambulatory Visit (INDEPENDENT_AMBULATORY_CARE_PROVIDER_SITE_OTHER): Payer: Self-pay | Admitting: Internal Medicine

## 2019-09-15 MED FILL — LOSARTAN POTASSIUM 50 MG TA: 50 | 90 days supply | Qty: 90 | Fill #0

## 2019-09-24 ENCOUNTER — Encounter (INDEPENDENT_AMBULATORY_CARE_PROVIDER_SITE_OTHER): Payer: Self-pay | Admitting: Nurse Practitioner

## 2019-09-24 ENCOUNTER — Other Ambulatory Visit: Payer: Self-pay

## 2019-09-24 ENCOUNTER — Ambulatory Visit (INDEPENDENT_AMBULATORY_CARE_PROVIDER_SITE_OTHER): Payer: 59 | Admitting: Nurse Practitioner

## 2019-09-24 ENCOUNTER — Telehealth (INDEPENDENT_AMBULATORY_CARE_PROVIDER_SITE_OTHER): Payer: Self-pay | Admitting: Nurse Practitioner

## 2019-09-24 VITALS — BP 128/80 | HR 88 | Temp 96.6°F | Resp 16 | Ht 61.0 in | Wt 258.2 lb

## 2019-09-24 DIAGNOSIS — R609 Edema, unspecified: Secondary | ICD-10-CM | POA: Diagnosis not present

## 2019-09-24 DIAGNOSIS — Z1211 Encounter for screening for malignant neoplasm of colon: Secondary | ICD-10-CM

## 2019-09-24 DIAGNOSIS — Z0001 Encounter for general adult medical examination with abnormal findings: Secondary | ICD-10-CM

## 2019-09-24 DIAGNOSIS — I10 Essential (primary) hypertension: Secondary | ICD-10-CM

## 2019-09-24 DIAGNOSIS — R7303 Prediabetes: Secondary | ICD-10-CM | POA: Diagnosis not present

## 2019-09-24 DIAGNOSIS — Z1159 Encounter for screening for other viral diseases: Secondary | ICD-10-CM | POA: Diagnosis not present

## 2019-09-24 DIAGNOSIS — R7989 Other specified abnormal findings of blood chemistry: Secondary | ICD-10-CM

## 2019-09-24 DIAGNOSIS — D72829 Elevated white blood cell count, unspecified: Secondary | ICD-10-CM | POA: Diagnosis not present

## 2019-09-24 DIAGNOSIS — Z124 Encounter for screening for malignant neoplasm of cervix: Secondary | ICD-10-CM | POA: Diagnosis not present

## 2019-09-24 MED ORDER — METFORMIN HCL 500 MG PO TABS: 250.0000 mg | ORAL_TABLET | Freq: Every day | ORAL | 2 refills | Status: DC

## 2019-09-24 MED ORDER — CHLORTHALIDONE 25 MG PO TABS: 25.0000 mg | ORAL_TABLET | Freq: Every day | ORAL | 0 refills | Status: DC
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
## 2019-09-24 NOTE — Telephone Encounter (Signed)
Will you look into the patient's referral to allergist? She tells me she still has not heard about appointment. Previously she did not have voicemail setup on her phone when they tried to contact her before, but she tells me this is now set up. Also, we can cancel referral to plastic surgery per patient request.

## 2019-09-24 NOTE — Patient Instructions (Signed)
[facility] Dietary Recommendations for Weight Loss What to Avoid . Avoid added sugars o Often added sugar can be found in processed foods such as many condiments, dry cereals, cakes, cookies, chips, crisps, crackers, candies, sweetened drinks, etc.  o Read labels and AVOID/DECREASE use of foods with the following in their ingredient list: Sugar, fructose, high fructose corn syrup, sucrose, glucose, maltose, dextrose, molasses, cane sugar, brown sugar, any type of syrup, agave nectar, etc.   . Avoid snacking in between meals . Avoid foods made with flour o If you are going to eat food made with flour, choose those made with whole-grains; and, minimize your consumption as much as is tolerable . Avoid processed foods o These foods are generally stocked in the middle of the grocery store. Focus on shopping on the perimeter of the grocery.  . Avoid Meat  o We recommend following a plant-based diet at [facility]. Thus, we recommend avoiding meat as a general rule. Consider eating beans, legumes, eggs, and/or dairy products for regular protein sources o If you plan on eating meat limit to 4 ounces of meat at a time and choose lean options such as Fish, chicken, turkey. Avoid red meat intake such as pork and/or steak What to Include . Vegetables o GREEN LEAFY VEGETABLES: Kale, spinach, mustard greens, collard greens, cabbage, broccoli, etc. o OTHER: Asparagus, cauliflower, eggplant, carrots, peas, Brussel sprouts, tomatoes, bell peppers, zucchini, beets, cucumbers, etc. . Grains, seeds, and legumes o Beans: kidney beans, black eyed peas, garbanzo beans, black beans, pinto beans, etc. o Whole, unrefined grains: brown rice, barley, bulgur, oatmeal, etc. . Healthy fats  o Avoid highly processed fats such as vegetable oil o Examples of healthy fats: avocado, olives, virgin olive oil, dark chocolate (?72% Cocoa), nuts (peanuts, almonds, walnuts, cashews, pecans, etc.) . None to Low  Intake of Animal Sources of Protein o Meat sources: chicken, turkey, salmon, tuna. Limit to 4 ounces of meat at one time. o Consider limiting dairy sources, but when choosing dairy focus on: PLAIN Greek yogurt, cottage cheese, high-protein milk . Fruit o Choose berries  When to Eat . Intermittent Fasting: o Choosing not to eat for a specific time period, but DO FOCUS ON HYDRATION when fasting o Multiple Techniques: - Time Restricted Eating: eat 3 meals in a day, each meal lasting no more than 60 minutes, no snacks between meals - 16-18 hour fast: fast for 16 to 18 hours up to 7 days a week. Often suggested to start with 2-3 nonconsecutive days per week.  . Remember the time you sleep is counted as fasting.  . Examples of eating schedule: Fast from 7:00pm-11:00am. Eat between 11:00am-7:00pm.  - 24-hour fast: fast for 24 hours up to every other day. Often suggested to start with 1 day per week . Remember the time you sleep is counted as fasting . Examples of eating schedule:  o Eating day: eat 2-3 meals on your eating day. If doing 2 meals, each meal should last no more than 90 minutes. If doing 3 meals, each meal should last no more than 60 minutes. Finish last meal by 7:00pm. o Fasting day: Fast until 7:00pm.  o IF YOU FEEL UNWELL FOR ANY REASON/IN ANY WAY WHEN FASTING, STOP FASTING BY EATING A NUTRITIOUS SNACK OR LIGHT MEAL o ALWAYS FOCUS ON HYDRATION DURING FASTS - Acceptable Hydration sources: water, broths, tea/coffee (black tea/coffee is best but using a small amount of whole-fat dairy products in coffee/tea is acceptable).  -   Poor Hydration Sources: anything with sugar or artificial sweeteners added to it  These recommendations have been developed for patients that are actively receiving medical care from either Dr. [facility] or Quillan Whitter, DNP, NP-C at [facility]. These recommendations are developed for patients with specific medical conditions and are not meant to be  distributed or used by others that are not actively receiving care from either provider listed above at [facility]. It is not appropriate to participate in the above eating plans without proper medical supervision.   Reference: Fung, J. The obesity code. [place]: [place]; 2016.   

## 2019-09-25 LAB — CBC WITH DIFFERENTIAL/PLATELET
Absolute Monocytes: 1212 cells/uL — ABNORMAL HIGH (ref 200–950)
Basophils Absolute: 44 cells/uL (ref 0–200)
Basophils Relative: 0.3 %
Eosinophils Absolute: 146 cells/uL (ref 15–500)
Eosinophils Relative: 1 %
HCT: 38.8 % (ref 35.0–45.0)
Hemoglobin: 13 g/dL (ref 11.7–15.5)
Lymphs Abs: 3110 cells/uL (ref 850–3900)
MCH: 28.4 pg (ref 27.0–33.0)
MCHC: 33.5 g/dL (ref 32.0–36.0)
MCV: 84.9 fL (ref 80.0–100.0)
MPV: 11.1 fL (ref 7.5–12.5)
Monocytes Relative: 8.3 %
Neutro Abs: 10089 cells/uL — ABNORMAL HIGH (ref 1500–7800)
Neutrophils Relative %: 69.1 %
Platelets: 333 10*3/uL (ref 140–400)
RBC: 4.57 10*6/uL (ref 3.80–5.10)
RDW: 13.8 % (ref 11.0–15.0)
Total Lymphocyte: 21.3 %
WBC: 14.6 10*3/uL — ABNORMAL HIGH (ref 3.8–10.8)

## 2019-09-25 LAB — TSH: TSH: 0.33 mIU/L — ABNORMAL LOW

## 2019-09-25 LAB — T4, FREE: Free T4: 1.2 ng/dL (ref 0.8–1.8)

## 2019-09-25 LAB — T3, FREE: T3, Free: 3.1 pg/mL (ref 2.3–4.2)

## 2019-09-25 LAB — HEPATITIS C ANTIBODY
Hepatitis C Ab: NONREACTIVE
SIGNAL TO CUT-OFF: 0.05 (ref <1.00)

## 2019-09-28 NOTE — Telephone Encounter (Signed)
Canceled the plastic surgery appointment in epic on referral side.

## 2019-09-30 ENCOUNTER — Encounter (INDEPENDENT_AMBULATORY_CARE_PROVIDER_SITE_OTHER): Payer: Self-pay | Admitting: *Deleted

## 2019-10-08 ENCOUNTER — Encounter (INDEPENDENT_AMBULATORY_CARE_PROVIDER_SITE_OTHER): Payer: Self-pay

## 2019-10-08 ENCOUNTER — Ambulatory Visit (INDEPENDENT_AMBULATORY_CARE_PROVIDER_SITE_OTHER): Payer: 59

## 2019-10-08 ENCOUNTER — Other Ambulatory Visit: Payer: Self-pay

## 2019-10-08 VITALS — BP 140/100

## 2019-10-08 DIAGNOSIS — I10 Essential (primary) hypertension: Secondary | ICD-10-CM

## 2019-10-08 MED ORDER — LOSARTAN POTASSIUM 100 MG PO TABS: 100.0000 mg | ORAL_TABLET | Freq: Every day | ORAL | 0 refills | Status: DC

## 2019-10-09 LAB — COMPLETE METABOLIC PANEL WITH GFR
AG Ratio: 1 (calc) (ref 1.0–2.5)
ALT: 12 U/L (ref 6–29)
AST: 16 U/L (ref 10–35)
Albumin: 3.7 g/dL (ref 3.6–5.1)
Alkaline phosphatase (APISO): 95 U/L (ref 37–153)
BUN: 14 mg/dL (ref 7–25)
CO2: 30 mmol/L (ref 20–32)
Calcium: 9.4 mg/dL (ref 8.6–10.4)
Chloride: 104 mmol/L (ref 98–110)
Creat: 0.77 mg/dL (ref 0.50–1.05)
GFR, Est African American: 103 mL/min/{1.73_m2} (ref >=60)
GFR, Est Non African American: 89 mL/min/{1.73_m2} (ref >=60)
Globulin: 3.8 g/dL (calc) — ABNORMAL HIGH (ref 1.9–3.7)
Glucose, Bld: 90 mg/dL (ref 65–99)
Potassium: 4.2 mmol/L (ref 3.5–5.3)
Sodium: 141 mmol/L (ref 135–146)
Total Bilirubin: 0.2 mg/dL (ref 0.2–1.2)
Total Protein: 7.5 g/dL (ref 6.1–8.1)

## 2019-10-13 ENCOUNTER — Ambulatory Visit
Admission: EM | Admit: 2019-10-13 | Discharge: 2019-10-13 | Disposition: A | Discharge status: Home / Self Care | Payer: 59 | Attending: Emergency Medicine | Admitting: Emergency Medicine

## 2019-10-13 ENCOUNTER — Other Ambulatory Visit: Payer: Self-pay

## 2019-10-13 DIAGNOSIS — Z20822 Contact with and (suspected) exposure to covid-19: Secondary | ICD-10-CM | POA: Diagnosis not present

## 2019-10-13 DIAGNOSIS — Z9189 Other specified personal risk factors, not elsewhere classified: Secondary | ICD-10-CM | POA: Diagnosis not present

## 2019-10-13 NOTE — ED Triage Notes (Signed)
covid test no s/s

## 2019-10-15 LAB — NOVEL CORONAVIRUS, NAA: SARS-CoV-2, NAA: NOT DETECTED

## 2019-10-15 LAB — SARS-COV-2, NAA 2 DAY TAT

## 2019-10-19 ENCOUNTER — Other Ambulatory Visit: Payer: Self-pay

## 2019-10-19 ENCOUNTER — Encounter: Payer: Self-pay | Admitting: Adult Health

## 2019-10-19 ENCOUNTER — Ambulatory Visit: Payer: 59 | Admitting: Adult Health

## 2019-10-19 ENCOUNTER — Other Ambulatory Visit (HOSPITAL_COMMUNITY)
Admission: RE | Admit: 2019-10-19 | Discharge: 2019-10-19 | Disposition: A | Discharge status: Home / Self Care | Payer: 59 | Source: Ambulatory Visit | Attending: Adult Health | Admitting: Adult Health

## 2019-10-19 VITALS — BP 120/75 | HR 97 | Ht 61.0 in | Wt 254.4 lb

## 2019-10-19 DIAGNOSIS — R232 Flushing: Secondary | ICD-10-CM | POA: Insufficient documentation

## 2019-10-19 DIAGNOSIS — Z9889 Other specified postprocedural states: Secondary | ICD-10-CM | POA: Diagnosis not present

## 2019-10-19 DIAGNOSIS — Z01419 Encounter for gynecological examination (general) (routine) without abnormal findings: Secondary | ICD-10-CM

## 2019-10-19 DIAGNOSIS — Z1211 Encounter for screening for malignant neoplasm of colon: Secondary | ICD-10-CM | POA: Diagnosis not present

## 2019-10-19 DIAGNOSIS — R102 Pelvic and perineal pain: Secondary | ICD-10-CM

## 2019-10-19 LAB — HEMOCCULT GUIAC POC 1CARD (OFFICE): Fecal Occult Blood, POC: NEGATIVE

## 2019-10-19 NOTE — Progress Notes (Signed)
Patient ID: Kristina Bonilla, female   DOB: 1965-09-11, 54 y.o.   MRN: [id number] History of Present Illness: Kristina Bonilla is a 54 year old black female, married, [id number], having periods since ablation in 2018, not has heavy but lasts 5-7 days and has bad cramping before period starts and pain LLQ. PCP is Dr Anastasio Champion.   Current Medications, Allergies, Past Medical History, Past Surgical History, Family History and Social History were reviewed in [employer] record.     Review of Systems:  Bleeding since ablation, periods lite but last 5-7 days Has bad cramping before period and LLQ pain +hot flashes and night sweats   Physical Exam:BP 120/75 (BP Location: Left Arm, Patient Position: Sitting, Cuff Size: Large)   Pulse 97   Ht 5\' 1"  (1.549 m)   Wt 254 lb 6.4 oz (115.4 kg)   LMP 09/14/2019 (Exact Date)   BMI 48.07 kg/m  General:  Well developed, well nourished, no acute distress Skin:  Warm and dry Lungs; Clear to auscultation bilaterally Cardiovascular: Regular rate and rhythm Pelvic:  External genitalia is normal in appearance, no lesions.  The vagina is normal in appearance. Urethra has no lesions or masses. The cervix is bulbous.Pap with high risk genotyping performed.  Uterus is felt to be normal size, shape, and contour.  No adnexal masses.LLQ tenderness noted.Bladder is non tender, no masses felt. Rectal: Good sphincter tone, no polyps, or hemorrhoids felt.  Hemoccult negative. Extremities/musculoskeletal:  No swelling or varicosities noted, no clubbing or cyanosis Psych:  No mood changes, alert and cooperative,seems happy AA is 0 Fall risk is low PHQ 9 score is 4,no SI   Upstream - 10/19/19 1558      Pregnancy Intention Screening   Does the patient want to become pregnant in the next year? No    Does the patient's partner want to become pregnant in the next year? No    Would the patient like to discuss contraceptive options today? No      Contraception Wrap Up    Current Method Female Sterilization    End Method Female Sterilization    Contraception Counseling Provided No         Examination chaperoned by Dwyane Dee LPN.   Impression and Plan: 1. Encounter for gynecological examination with Papanicolaou smear of cervix Pap sent  Physical with PCP  Labs with PCP Pap in 3 years if normal   2. Encounter for screening fecal occult blood testing  3. Pelvic pain Will get GYN [place] in 4 weeks to assess uterus and ovaries, will talk when results back   4. Hot flashes Check [facility] Will talk when labs back   5. S/P endometrial ablation

## 2019-10-20 ENCOUNTER — Telehealth: Payer: Self-pay | Admitting: Adult Health

## 2019-10-20 LAB — FOLLICLE STIMULATING HORMONE: FSH: 14.3 m[IU]/mL

## 2019-10-20 NOTE — Telephone Encounter (Signed)
Pt aware that [facility] is 14.3 so not PM yet, will talk after [place] in October, call if needs anything before then

## 2019-10-21 LAB — CYTOLOGY - PAP
Adequacy: ABSENT
Comment: NEGATIVE
Diagnosis: NEGATIVE
High risk HPV: NEGATIVE

## 2019-10-22 ENCOUNTER — Ambulatory Visit (INDEPENDENT_AMBULATORY_CARE_PROVIDER_SITE_OTHER): Payer: 59 | Admitting: Nurse Practitioner

## 2019-11-12 ENCOUNTER — Other Ambulatory Visit: Payer: Self-pay | Admitting: *Deleted

## 2019-11-12 DIAGNOSIS — R102 Pelvic and perineal pain: Secondary | ICD-10-CM

## 2019-11-16 ENCOUNTER — Other Ambulatory Visit: Payer: 59

## 2019-11-16 ENCOUNTER — Other Ambulatory Visit (HOSPITAL_COMMUNITY): Payer: Self-pay | Admitting: Internal Medicine

## 2019-11-16 DIAGNOSIS — Z1231 Encounter for screening mammogram for malignant neoplasm of breast: Secondary | ICD-10-CM

## 2019-11-19 ENCOUNTER — Other Ambulatory Visit: Payer: Self-pay

## 2019-11-19 ENCOUNTER — Ambulatory Visit (HOSPITAL_COMMUNITY)
Admission: RE | Admit: 2019-11-19 | Discharge: 2019-11-19 | Disposition: A | Discharge status: Home / Self Care | Payer: 59 | Source: Ambulatory Visit | Attending: Adult Health | Admitting: Adult Health

## 2019-11-19 DIAGNOSIS — N800 Endometriosis of uterus: Secondary | ICD-10-CM | POA: Diagnosis not present

## 2019-11-19 DIAGNOSIS — R102 Pelvic and perineal pain: Secondary | ICD-10-CM | POA: Diagnosis not present

## 2019-11-19 DIAGNOSIS — D259 Leiomyoma of uterus, unspecified: Secondary | ICD-10-CM | POA: Diagnosis not present

## 2019-11-25 ENCOUNTER — Encounter: Payer: Self-pay | Admitting: Adult Health

## 2019-11-25 ENCOUNTER — Telehealth: Payer: Self-pay | Admitting: Adult Health

## 2019-11-25 DIAGNOSIS — D219 Benign neoplasm of connective and other soft tissue, unspecified: Secondary | ICD-10-CM

## 2019-11-25 HISTORY — DX: Benign neoplasm of connective and other soft tissue, unspecified: D21.9

## 2019-11-25 NOTE — Telephone Encounter (Signed)
Pt aware US showed fibroids, to make appt with Dr Kristina Bonilla to discuss

## 2019-12-07 ENCOUNTER — Encounter: Payer: Self-pay | Admitting: Obstetrics & Gynecology

## 2019-12-07 ENCOUNTER — Ambulatory Visit (INDEPENDENT_AMBULATORY_CARE_PROVIDER_SITE_OTHER): Payer: 59 | Admitting: Obstetrics & Gynecology

## 2019-12-07 VITALS — BP 139/82 | HR 92 | Wt 254.0 lb

## 2019-12-07 DIAGNOSIS — N800 Endometriosis of uterus: Secondary | ICD-10-CM

## 2019-12-07 DIAGNOSIS — N946 Dysmenorrhea, unspecified: Secondary | ICD-10-CM

## 2019-12-07 DIAGNOSIS — N8003 Adenomyosis of the uterus: Secondary | ICD-10-CM

## 2019-12-07 MED ORDER — PROGESTERONE 200 MG PO CAPS: 200.0000 mg | ORAL_CAPSULE | Freq: Every day | ORAL | 11 refills | Status: DC
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
## 2019-12-09 ENCOUNTER — Other Ambulatory Visit (HOSPITAL_COMMUNITY): Payer: Self-pay | Admitting: Internal Medicine

## 2019-12-09 DIAGNOSIS — Z1231 Encounter for screening mammogram for malignant neoplasm of breast: Secondary | ICD-10-CM

## 2019-12-14 ENCOUNTER — Other Ambulatory Visit: Payer: Self-pay

## 2019-12-14 ENCOUNTER — Ambulatory Visit (HOSPITAL_COMMUNITY)
Admission: RE | Admit: 2019-12-14 | Discharge: 2019-12-14 | Disposition: A | Discharge status: Home / Self Care | Payer: 59 | Source: Ambulatory Visit | Attending: Internal Medicine | Admitting: Internal Medicine

## 2019-12-14 ENCOUNTER — Encounter (HOSPITAL_COMMUNITY): Payer: Self-pay

## 2019-12-14 DIAGNOSIS — Z1231 Encounter for screening mammogram for malignant neoplasm of breast: Secondary | ICD-10-CM | POA: Insufficient documentation

## 2019-12-24 DIAGNOSIS — H5213 Myopia, bilateral: Secondary | ICD-10-CM | POA: Diagnosis not present

## 2019-12-30 ENCOUNTER — Ambulatory Visit (INDEPENDENT_AMBULATORY_CARE_PROVIDER_SITE_OTHER): Payer: 59 | Admitting: Internal Medicine

## 2020-01-05 ENCOUNTER — Encounter: Payer: Self-pay | Admitting: Allergy

## 2020-01-05 ENCOUNTER — Other Ambulatory Visit: Payer: Self-pay

## 2020-01-05 ENCOUNTER — Ambulatory Visit: Payer: 59 | Admitting: Allergy

## 2020-01-05 VITALS — BP 130/90 | HR 77 | Temp 97.7°F | Resp 18 | Ht 61.0 in | Wt 263.5 lb

## 2020-01-05 DIAGNOSIS — J3089 Other allergic rhinitis: Secondary | ICD-10-CM | POA: Diagnosis not present

## 2020-01-05 DIAGNOSIS — T781XXA Other adverse food reactions, not elsewhere classified, initial encounter: Secondary | ICD-10-CM | POA: Insufficient documentation

## 2020-01-05 DIAGNOSIS — T781XXD Other adverse food reactions, not elsewhere classified, subsequent encounter: Secondary | ICD-10-CM

## 2020-01-05 DIAGNOSIS — J4520 Mild intermittent asthma, uncomplicated: Secondary | ICD-10-CM | POA: Insufficient documentation

## 2020-01-05 DIAGNOSIS — L509 Urticaria, unspecified: Secondary | ICD-10-CM | POA: Insufficient documentation

## 2020-01-05 MED ORDER — CETIRIZINE HCL 10 MG PO TABS: 10.0000 mg | ORAL_TABLET | Freq: Every day | ORAL | 5 refills | Status: DC

## 2020-01-05 MED ORDER — MONTELUKAST SODIUM 10 MG PO TABS: 10.0000 mg | ORAL_TABLET | Freq: Every day | ORAL | 5 refills | Status: DC

## 2020-01-05 MED ORDER — FAMOTIDINE 20 MG PO TABS: 20.0000 mg | ORAL_TABLET | Freq: Two times a day (BID) | ORAL | 5 refills | Status: DC

## 2020-01-05 MED ORDER — EPINEPHRINE 0.3 MG/0.3ML IJ SOAJ: 0.3000 mg | INTRAMUSCULAR | 2 refills | Status: AC | PRN

## 2020-01-05 NOTE — Patient Instructions (Addendum)
Today's skin testing showed: Positive to grass, weed, ragweed, trees, dust mites, dog, tobacco leaf, cockroach. Positive to milk. Negative to other common foods including shellfish.  Results given.  Hives:  Start zyrtec (cetirizine) 10mg  once a day at night.   If hives are not controlled or causes drowsiness let us know.  Start Pepcid (famotidine) 20mg  twice a day.   If no improvement after 2 weeks then: Start Singulair (montelukast) 10mg  daily at night. Cautioned that in some children/adults can experience behavioral changes including hyperactivity, agitation, depression, sleep disturbances and suicidal ideations. These side effects are rare, but if you notice them you should notify me and discontinue Singulair (montelukast). . Avoid the following potential triggers: alcohol, tight clothing, NSAIDs, hot showers and getting overheated. . Get bloodwork: o We are ordering labs, so please allow 1-2 weeks for the results to come back. o With the newly implemented Cures Act, the labs might be visible to you at the same time that they become visible to me. However, I will not address the results until all of the results are back, so please be patient.   Environmental allergies  Start environmental control measures as below.  May use over the counter antihistamines such as Zyrtec (cetirizine) daily as above.  Asthma  May use albuterol rescue inhaler 2 puffs every 4 to 6 hours as needed for shortness of breath, chest tightness, coughing, and wheezing. May use albuterol rescue inhaler 2 puffs 5 to 15 minutes prior to strenuous physical activities. Monitor frequency of use.   Food allergy  Continue strict avoidance of shellfish.  Limit dairy and see if the itching/rash improves.  I have prescribed epinephrine injectable and demonstrated proper use. For mild symptoms you can take over the counter antihistamines such as Benadryl and monitor symptoms closely. If symptoms worsen or if you have  severe symptoms including breathing issues, throat closure, significant swelling, whole body hives, severe diarrhea and vomiting, lightheadedness then inject epinephrine and seek immediate medical care afterwards.  Food action plan given. Get bloodwork to double check shellfish and dairy.  Follow up in 6 weeks or sooner if needed in [place] with Dr. Ernst Bowler  Reducing Pollen Exposure . Pollen seasons: trees (spring), grass (summer) and ragweed/weeds (fall). Marland Kitchen Keep windows closed in your home and car to lower pollen exposure.  Kristina Bonilla air conditioning in the bedroom and throughout the house if possible.  . Avoid going out in dry windy days - especially early morning. . Pollen counts are highest between 5 - 10 AM and on dry, hot and windy days.  . Save outside activities for late afternoon or after a heavy rain, when pollen levels are lower.  . Avoid mowing of grass if you have grass pollen allergy. Marland Kitchen Be aware that pollen can also be transported indoors on people and pets.  . Dry your clothes in an automatic dryer rather than hanging them outside where they might collect pollen.  . Rinse hair and eyes before bedtime. Control of House Dust Mite Allergen . Dust mite allergens are a common trigger of allergy and asthma symptoms. While they can be found throughout the house, these microscopic creatures thrive in warm, humid environments such as bedding, upholstered furniture and carpeting. . Because so much time is spent in the bedroom, it is essential to reduce mite levels there.  . Encase pillows, mattresses, and box springs in special allergen-proof fabric covers or airtight, zippered plastic covers.  Kristina Bonilla should be washed weekly in hot water (130  F) and dried in a hot dryer. Allergen-proof covers are available for comforters and pillows that can't be regularly washed.  Kristina Bonilla the allergy-proof covers every few months. Minimize clutter in the bedroom. Keep pets out of the bedroom.   Marland Kitchen Keep humidity less than 50% by using a dehumidifier or air conditioning. You can buy a humidity measuring device called a hygrometer to monitor this.  . If possible, replace carpets with hardwood, linoleum, or washable area rugs. If that's not possible, vacuum frequently with a vacuum that has a HEPA filter. . Remove all upholstered furniture and non-washable window drapes from the bedroom. . Remove all non-washable stuffed toys from the bedroom.  Wash stuffed toys weekly. Pet Allergen Avoidance: . Contrary to popular opinion, there are no "hypoallergenic" breeds of dogs or cats. That is because people are not allergic to an animal's hair, but to an allergen found in the animal's saliva, dander (dead skin flakes) or urine. Pet allergy symptoms typically occur within minutes. For some people, symptoms can build up and become most severe 8 to 12 hours after contact with the animal. People with severe allergies can experience reactions in public places if dander has been transported on the pet owners' clothing. Marland Kitchen Keeping an animal outdoors is only a partial solution, since homes with pets in the yard still have higher concentrations of animal allergens. . Before getting a pet, ask your allergist to determine if you are allergic to animals. If your pet is already considered part of your family, try to minimize contact and keep the pet out of the bedroom and other rooms where you spend a great deal of time. . As with dust mites, vacuum carpets often or replace carpet with a hardwood floor, tile or linoleum. . High-efficiency particulate air (HEPA) cleaners can reduce allergen levels over time. . While dander and saliva are the source of cat and dog allergens, urine is the source of allergens from rabbits, hamsters, mice and [place] pigs; so ask a non-allergic family member to clean the animal's cage. . If you have a pet allergy, talk to your allergist about the potential for allergy immunotherapy (allergy  shots). This strategy can often provide long-term relief.  Cockroach Allergen Avoidance Cockroaches are often found in the homes of densely populated urban areas, schools or commercial buildings, but these creatures can lurk almost anywhere. This does not mean that you have a dirty house or living area. . Block all areas where roaches can enter the home. This includes crevices, wall cracks and windows.  . Cockroaches need water to survive, so fix and seal all leaky faucets and pipes. Have an exterminator go through the house when your family and pets are gone to eliminate any remaining roaches. Marland Kitchen Keep food in lidded containers and put pet food dishes away after your pets are done eating. Vacuum and sweep the floor after meals, and take out garbage and recyclables. Use lidded garbage containers in the kitchen. Wash dishes immediately after use and clean under stoves, refrigerators or toasters where crumbs can accumulate. Wipe off the stove and other kitchen surfaces and cupboards regularly.  Skin care recommendations  Bath time: . Always use lukewarm water. AVOID very hot or cold water. Marland Kitchen Keep bathing time to 5-10 minutes. . Do NOT use bubble bath. . Use a mild soap and use just enough to wash the dirty areas. . Do NOT scrub skin vigorously.  . After bathing, pat dry your skin with a towel. Do NOT rub  or scrub the skin.  Moisturizers and prescriptions:  . ALWAYS apply moisturizers immediately after bathing (within 3 minutes). This helps to lock-in moisture. . Use the moisturizer several times a day over the whole body. Kristina Bonilla summer moisturizers include: Aveeno, CeraVe, Cetaphil. Kristina Bonilla winter moisturizers include: Aquaphor, Vaseline, Cerave, Cetaphil, Eucerin, Vanicream. . When using moisturizers along with medications, the moisturizer should be applied about one hour after applying the medication to prevent diluting effect of the medication or moisturize around where you applied the  medications. When not using medications, the moisturizer can be continued twice daily as maintenance.  Laundry and clothing: . Avoid laundry products with added color or perfumes. . Use unscented hypo-allergenic laundry products such as Tide free, Cheer free & gentle, and All free and clear.  . If the skin still seems dry or sensitive, you can try double-rinsing the clothes. . Avoid tight or scratchy clothing such as wool. . Do not use fabric softeners or dyer sheets.

## 2020-01-05 NOTE — Assessment & Plan Note (Addendum)
Breaking out in pruritic rash starting in May 2021.  This can occur anywhere on her body lasting a few hours at a time.  No specific triggers noted but worse after hot showers and pressure areas.  Denies any changes in diet, medications, personal care products or recent infections.  Tried hydrocortisone cream topically and Claritin with minimal benefit.  Recent blood work showed abnormal TSH and CBC counts.  Apparently she was evaluated by endocrinology and was told no issues with her thyroid per patient report.   Today's skin testing showed: Positive to grass, weed, ragweed, trees, dust mites, dog, tobacco leaf, cockroach. Positive to milk. Negative to other common foods including shellfish.   Discussed with patient that I'm not sure how much of the above allergens are contributing to her rashes.   Start zyrtec (cetirizine) 10mg  once a day at night (claritin caused drowsiness).  If hives are not controlled or causes drowsiness let us know.  Start Pepcid (famotidine) 20mg  twice a day.   If no improvement after 2 weeks then: Start Singulair (montelukast) 10mg  daily at night. Cautioned that in some children/adults can experience behavioral changes including hyperactivity, agitation, depression, sleep disturbances and suicidal ideations. These side effects are rare, but if you notice them you should notify me and discontinue Singulair (montelukast). . Avoid the following potential triggers: alcohol, tight clothing, NSAIDs, hot showers and getting overheated. . Get bloodwork as below to rule out other etiologies.

## 2020-01-05 NOTE — Assessment & Plan Note (Signed)
Rhinoconjunctivitis symptoms in the spring.  Takes Claritin with good benefit.  Today's skin testing showed: Positive to grass, weed, ragweed, trees, dust mites, dog, tobacco leaf, cockroach.  Start environmental control measures as below.  May use over the counter antihistamines such as Zyrtec (cetirizine) daily as above.

## 2020-01-05 NOTE — Assessment & Plan Note (Signed)
Wheezing at times in the winter months.  Uses albuterol as needed with good benefit.  Today's spirometry showed some restriction most likely due to body habitus.  May use albuterol rescue inhaler 2 puffs every 4 to 6 hours as needed for shortness of breath, chest tightness, coughing, and wheezing. May use albuterol rescue inhaler 2 puffs 5 to 15 minutes prior to strenuous physical activities. Monitor frequency of use.
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
## 2020-01-05 NOTE — Assessment & Plan Note (Signed)
Crabs caused itching and vomiting. Milk causes GI issues. Tolerate yogurt though.  Today's skin testing showed: Positive to milk. Negative to other common foods including shellfish.   Continue strict avoidance of shellfish.  Limit dairy and see if the itching/rash improves.  I have prescribed epinephrine injectable and demonstrated proper use. For mild symptoms you can take over the counter antihistamines such as Benadryl and monitor symptoms closely. If symptoms worsen or if you have severe symptoms including breathing issues, throat closure, significant swelling, whole body hives, severe diarrhea and vomiting, lightheadedness then inject epinephrine and seek immediate medical care afterwards.  Food action plan given. Get bloodwork to double check results for shellfish and dairy.

## 2020-01-12 ENCOUNTER — Encounter (INDEPENDENT_AMBULATORY_CARE_PROVIDER_SITE_OTHER): Payer: Self-pay | Admitting: Internal Medicine

## 2020-01-12 ENCOUNTER — Ambulatory Visit (INDEPENDENT_AMBULATORY_CARE_PROVIDER_SITE_OTHER): Payer: 59 | Admitting: Internal Medicine

## 2020-01-12 ENCOUNTER — Other Ambulatory Visit: Payer: Self-pay

## 2020-01-12 VITALS — BP 124/86 | HR 89 | Temp 97.6°F | Resp 18 | Ht 61.0 in | Wt 256.0 lb

## 2020-01-12 DIAGNOSIS — N951 Menopausal and female climacteric states: Secondary | ICD-10-CM | POA: Diagnosis not present

## 2020-01-12 DIAGNOSIS — I10 Essential (primary) hypertension: Secondary | ICD-10-CM | POA: Diagnosis not present

## 2020-01-12 DIAGNOSIS — E559 Vitamin D deficiency, unspecified: Secondary | ICD-10-CM

## 2020-01-12 DIAGNOSIS — R232 Flushing: Secondary | ICD-10-CM

## 2020-01-12 MED FILL — HYDROCHLOROTHIAZIDE 25 MG T: 25 | 90 days supply | Qty: 90 | Fill #1

## 2020-01-12 MED FILL — AMLODIPINE BESYLATE 10 MG T: 10 | 90 days supply | Qty: 90 | Fill #1

## 2020-01-12 MED FILL — LOSARTAN POTASSIUM 50 MG TA: 50 | 90 days supply | Qty: 90 | Fill #1
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
## 2020-01-13 LAB — ALLERGEN PROFILE, SHELLFISH
Clam IgE: <0.1 kU/L
F023-IgE Crab: <0.1 kU/L
F080-IgE Lobster: <0.1 kU/L
F290-IgE Oyster: <0.1 kU/L
Scallop IgE: <0.1 kU/L
Shrimp IgE: 0.54 kU/L — AB

## 2020-01-13 LAB — CBC WITH DIFFERENTIAL/PLATELET
Basophils Absolute: 0 10*3/uL (ref 0.0–0.2)
Basos: 0 %
EOS (ABSOLUTE): 0.2 10*3/uL (ref 0.0–0.4)
Eos: 2 %
Hematocrit: 39.4 % (ref 34.0–46.6)
Hemoglobin: 12.9 g/dL (ref 11.1–15.9)
Immature Grans (Abs): 0 10*3/uL (ref 0.0–0.1)
Immature Granulocytes: 0 %
Lymphocytes Absolute: 2.5 10*3/uL (ref 0.7–3.1)
Lymphs: 21 %
MCH: 28.4 pg (ref 26.6–33.0)
MCHC: 32.7 g/dL (ref 31.5–35.7)
MCV: 87 fL (ref 79–97)
Monocytes Absolute: 0.7 10*3/uL (ref 0.1–0.9)
Monocytes: 6 %
Neutrophils Absolute: 8.3 10*3/uL — ABNORMAL HIGH (ref 1.4–7.0)
Neutrophils: 71 %
Platelets: 316 10*3/uL (ref 150–450)
RBC: 4.55 x10E6/uL (ref 3.77–5.28)
RDW: 12.6 % (ref 11.7–15.4)
WBC: 11.9 10*3/uL — ABNORMAL HIGH (ref 3.4–10.8)

## 2020-01-13 LAB — IGE MILK W/ COMPONENT REFLEX: F002-IgE Milk: <0.1 kU/L

## 2020-01-13 LAB — COMPREHENSIVE METABOLIC PANEL
ALT: 14 IU/L (ref 0–32)
AST: 16 IU/L (ref 0–40)
Albumin/Globulin Ratio: 1 — ABNORMAL LOW (ref 1.2–2.2)
Albumin: 3.8 g/dL (ref 3.8–4.9)
Alkaline Phosphatase: 103 IU/L (ref 44–121)
BUN/Creatinine Ratio: 12 (ref 9–23)
BUN: 9 mg/dL (ref 6–24)
Bilirubin Total: 0.3 mg/dL (ref 0.0–1.2)
CO2: 28 mmol/L (ref 20–29)
Calcium: 9 mg/dL (ref 8.7–10.2)
Chloride: 100 mmol/L (ref 96–106)
Creatinine, Ser: 0.73 mg/dL (ref 0.57–1.00)
GFR calc Af Amer: 110 mL/min/{1.73_m2} (ref >59)
GFR calc non Af Amer: 95 mL/min/{1.73_m2} (ref >59)
Globulin, Total: 3.8 g/dL (ref 1.5–4.5)
Glucose: 132 mg/dL — ABNORMAL HIGH (ref 65–99)
Potassium: 3.9 mmol/L (ref 3.5–5.2)
Sodium: 138 mmol/L (ref 134–144)
Total Protein: 7.6 g/dL (ref 6.0–8.5)

## 2020-01-13 LAB — C3 AND C4
Complement C3, Serum: 173 mg/dL — ABNORMAL HIGH (ref 82–167)
Complement C4, Serum: 15 mg/dL (ref 12–38)

## 2020-01-13 LAB — ALPHA-GAL PANEL
Alpha Gal IgE*: <0.1 kU/L (ref <0.10)
Beef (Bos spp) IgE: <0.1 kU/L (ref <0.35)
Class Interpretation: 0
Class Interpretation: 0
Class Interpretation: 0
Lamb/Mutton (Ovis spp) IgE: <0.1 kU/L (ref <0.35)
Pork (Sus spp) IgE: <0.1 kU/L (ref <0.35)

## 2020-01-13 LAB — ANA W/REFLEX: Anti Nuclear Antibody (ANA): POSITIVE — AB

## 2020-01-13 LAB — TRYPTASE: Tryptase: 5.2 ug/L (ref 2.2–13.2)

## 2020-01-13 LAB — ENA+DNA/DS+SJORGEN'S
ENA RNP Ab: 0.3 AI (ref 0.0–0.9)
ENA SM Ab Ser-aCnc: <0.2 AI (ref 0.0–0.9)
ENA SSA (RO) Ab: >8 AI — ABNORMAL HIGH (ref 0.0–0.9)
ENA SSB (LA) Ab: >8 AI — ABNORMAL HIGH (ref 0.0–0.9)
dsDNA Ab: <1 IU/mL (ref 0–9)

## 2020-01-13 LAB — SEDIMENTATION RATE: Sed Rate: 16 mm/hr (ref 0–40)

## 2020-01-13 LAB — THYROID CASCADE PROFILE: TSH: 0.475 u[IU]/mL (ref 0.450–4.500)

## 2020-01-13 LAB — C-REACTIVE PROTEIN: CRP: 20 mg/L — ABNORMAL HIGH (ref 0–10)

## 2020-01-13 LAB — CHRONIC URTICARIA: cu index: 3.9 (ref <10)

## 2020-01-21 ENCOUNTER — Ambulatory Visit: Payer: 59 | Attending: Internal Medicine

## 2020-01-21 DIAGNOSIS — Z23 Encounter for immunization: Secondary | ICD-10-CM

## 2020-01-21 NOTE — Progress Notes (Signed)
   Covid-19 Vaccination Clinic  Name:  Kristina Bonilla    MRN: [id number] DOB: [birth year]  01/21/2020  [name] was observed post Covid-19 immunization for 15 minutes without incident. She was provided with Vaccine Information Sheet and instruction to access the V-Safe system.   [name] was instructed to call 911 with any severe reactions post vaccine: Marland Kitchen Difficulty breathing  . Swelling of face and throat  . A fast heartbeat  . A bad rash all over body  . Dizziness and weakness   Immunizations Administered    Name Date Dose VIS Date Route   Moderna Covid-19 Booster Vaccine 01/21/2020  3:39 PM 0.25 mL 11/25/2019 Intramuscular   Manufacturer: Moderna   Lot: [id number]   [place]: [id number]

## 2020-01-28 ENCOUNTER — Telehealth: Payer: Self-pay

## 2020-01-28 NOTE — Telephone Encounter (Signed)
Please schedule referral for Rheumatology for  elevated  SSA and SSB antibody contributing to inflammation and rash.

## 2020-02-02 NOTE — Telephone Encounter (Signed)
Referral placed to [facility] for review and scheduling.  I tried to contact the patient, but she does not have a voicemail box set up .  I will try reaching out again later.   Thanks

## 2020-02-10 ENCOUNTER — Other Ambulatory Visit: Payer: Self-pay

## 2020-02-10 ENCOUNTER — Ambulatory Visit (INDEPENDENT_AMBULATORY_CARE_PROVIDER_SITE_OTHER): Payer: 59 | Admitting: Internal Medicine

## 2020-02-10 ENCOUNTER — Encounter (INDEPENDENT_AMBULATORY_CARE_PROVIDER_SITE_OTHER): Payer: Self-pay | Admitting: Internal Medicine

## 2020-02-10 DIAGNOSIS — I10 Essential (primary) hypertension: Secondary | ICD-10-CM | POA: Diagnosis not present

## 2020-02-10 DIAGNOSIS — N951 Menopausal and female climacteric states: Secondary | ICD-10-CM | POA: Diagnosis not present

## 2020-02-10 DIAGNOSIS — E559 Vitamin D deficiency, unspecified: Secondary | ICD-10-CM

## 2020-02-10 DIAGNOSIS — R7989 Other specified abnormal findings of blood chemistry: Secondary | ICD-10-CM

## 2020-02-10 DIAGNOSIS — R7303 Prediabetes: Secondary | ICD-10-CM | POA: Diagnosis not present

## 2020-02-10 NOTE — Patient Instructions (Signed)
[facility] Dietary Recommendations for Weight Loss What to Avoid . Avoid added sugars o Often added sugar can be found in processed foods such as many condiments, dry cereals, cakes, cookies, chips, crisps, crackers, candies, sweetened drinks, etc.  o Read labels and AVOID/DECREASE use of foods with the following in their ingredient list: Sugar, fructose, high fructose corn syrup, sucrose, glucose, maltose, dextrose, molasses, cane sugar, brown sugar, any type of syrup, agave nectar, etc.   . Avoid snacking in between meals . Avoid foods made with flour o If you are going to eat food made with flour, choose those made with whole-grains; and, minimize your consumption as much as is tolerable . Avoid processed foods o These foods are generally stocked in the middle of the grocery store. Focus on shopping on the perimeter of the grocery.  . Avoid Meat  o We recommend following a plant-based diet at [facility]. Thus, we recommend avoiding meat as a general rule. Consider eating beans, legumes, eggs, and/or dairy products for regular protein sources o If you plan on eating meat limit to 4 ounces of meat at a time and choose lean options such as Fish, chicken, turkey. Avoid red meat intake such as pork and/or steak What to Include . Vegetables o GREEN LEAFY VEGETABLES: Kale, spinach, mustard greens, collard greens, cabbage, broccoli, etc. o OTHER: Asparagus, cauliflower, eggplant, carrots, peas, Brussel sprouts, tomatoes, bell peppers, zucchini, beets, cucumbers, etc. . Grains, seeds, and legumes o Beans: kidney beans, black eyed peas, garbanzo beans, black beans, pinto beans, etc. o Whole, unrefined grains: brown rice, barley, bulgur, oatmeal, etc. . Healthy fats  o Avoid highly processed fats such as vegetable oil o Examples of healthy fats: avocado, olives, virgin olive oil, dark chocolate (?72% Cocoa), nuts (peanuts, almonds, walnuts, cashews, pecans, etc.) . None to Low  Intake of Animal Sources of Protein o Meat sources: chicken, turkey, salmon, tuna. Limit to 4 ounces of meat at one time. o Consider limiting dairy sources, but when choosing dairy focus on: PLAIN Greek yogurt, cottage cheese, high-protein milk . Fruit o Choose berries  When to Eat . Intermittent Fasting: o Choosing not to eat for a specific time period, but DO FOCUS ON HYDRATION when fasting o Multiple Techniques: - Time Restricted Eating: eat 3 meals in a day, each meal lasting no more than 60 minutes, no snacks between meals - 16-18 hour fast: fast for 16 to 18 hours up to 7 days a week. Often suggested to start with 2-3 nonconsecutive days per week.  . Remember the time you sleep is counted as fasting.  . Examples of eating schedule: Fast from 7:00pm-11:00am. Eat between 11:00am-7:00pm.  - 24-hour fast: fast for 24 hours up to every other day. Often suggested to start with 1 day per week . Remember the time you sleep is counted as fasting . Examples of eating schedule:  o Eating day: eat 2-3 meals on your eating day. If doing 2 meals, each meal should last no more than 90 minutes. If doing 3 meals, each meal should last no more than 60 minutes. Finish last meal by 7:00pm. o Fasting day: Fast until 7:00pm.  o IF YOU FEEL UNWELL FOR ANY REASON/IN ANY WAY WHEN FASTING, STOP FASTING BY EATING A NUTRITIOUS SNACK OR LIGHT MEAL o ALWAYS FOCUS ON HYDRATION DURING FASTS - Acceptable Hydration sources: water, broths, tea/coffee (black tea/coffee is best but using a small amount of whole-fat dairy products in coffee/tea is acceptable).  -   Poor Hydration Sources: anything with sugar or artificial sweeteners added to it  These recommendations have been developed for patients that are actively receiving medical care from either Dr. Ashutosh Dieguez or Sarah Gray, DNP, NP-C at [facility]. These recommendations are developed for patients with specific medical conditions and are not meant to be  distributed or used by others that are not actively receiving care from either provider listed above at [facility]. It is not appropriate to participate in the above eating plans without proper medical supervision.   Reference: Fung, J. The obesity code. [place]: [place]; 2016.   
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
## 2020-02-11 LAB — HEMOGLOBIN A1C
Hgb A1c MFr Bld: 6.1 % of total Hgb — ABNORMAL HIGH (ref <5.7)
Mean Plasma Glucose: 128 mg/dL
eAG (mmol/L): 7.1 mmol/L

## 2020-02-11 LAB — VITAMIN D 25 HYDROXY (VIT D DEFICIENCY, FRACTURES): Vit D, 25-Hydroxy: 27 ng/mL — ABNORMAL LOW (ref 30–100)

## 2020-02-11 LAB — LIPID PANEL
Cholesterol: 143 mg/dL (ref <200)
HDL: 46 mg/dL — ABNORMAL LOW (ref >=50)
LDL Cholesterol (Calc): 79 mg/dL (calc)
Non-HDL Cholesterol (Calc): 97 mg/dL (calc) (ref <130)
Total CHOL/HDL Ratio: 3.1 (calc) (ref <5.0)
Triglycerides: 94 mg/dL (ref <150)

## 2020-02-11 LAB — T3, FREE: T3, Free: 3.2 pg/mL (ref 2.3–4.2)

## 2020-02-11 LAB — TSH: TSH: 0.34 mIU/L — ABNORMAL LOW

## 2020-02-11 LAB — T4, FREE: Free T4: 1 ng/dL (ref 0.8–1.8)

## 2020-02-11 NOTE — Progress Notes (Signed)
Please call this patient.  Her vitamin D levels are extremely low so make sure she is taking vitamin D3 10,000 units daily. Her hemoglobin A1c is slightly higher at 6.1% so she needs to continue to focus on nutrition that we have discussed before. Follow-up as scheduled.

## 2020-02-19 ENCOUNTER — Ambulatory Visit: Payer: 59 | Admitting: Allergy & Immunology

## 2020-03-07 NOTE — Progress Notes (Signed)
Called patient; no way to leave a message.

## 2020-03-08 ENCOUNTER — Encounter: Payer: Self-pay | Admitting: Obstetrics & Gynecology

## 2020-03-08 ENCOUNTER — Other Ambulatory Visit: Payer: Self-pay

## 2020-03-08 ENCOUNTER — Ambulatory Visit (INDEPENDENT_AMBULATORY_CARE_PROVIDER_SITE_OTHER): Payer: No Typology Code available for payment source | Admitting: Obstetrics & Gynecology

## 2020-03-08 VITALS — BP 125/82 | HR 93 | Ht 61.0 in | Wt 252.0 lb

## 2020-03-08 DIAGNOSIS — N800 Endometriosis of uterus: Secondary | ICD-10-CM | POA: Diagnosis not present

## 2020-03-08 DIAGNOSIS — N8003 Adenomyosis of the uterus: Secondary | ICD-10-CM

## 2020-03-08 DIAGNOSIS — N946 Dysmenorrhea, unspecified: Secondary | ICD-10-CM | POA: Diagnosis not present

## 2020-03-08 NOTE — Progress Notes (Signed)
Pt called. Showed her how to pull up opn mychart. So she will be able to review and communicate in future.
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
## 2020-03-22 ENCOUNTER — Other Ambulatory Visit (INDEPENDENT_AMBULATORY_CARE_PROVIDER_SITE_OTHER): Payer: Self-pay | Admitting: Internal Medicine

## 2020-03-22 ENCOUNTER — Telehealth (INDEPENDENT_AMBULATORY_CARE_PROVIDER_SITE_OTHER): Payer: Self-pay | Admitting: Internal Medicine

## 2020-03-22 DIAGNOSIS — I10 Essential (primary) hypertension: Secondary | ICD-10-CM

## 2020-03-22 DIAGNOSIS — R609 Edema, unspecified: Secondary | ICD-10-CM

## 2020-03-22 DIAGNOSIS — R7303 Prediabetes: Secondary | ICD-10-CM

## 2020-03-22 MED ORDER — METFORMIN HCL 500 MG PO TABS: 250.0000 mg | ORAL_TABLET | Freq: Every day | ORAL | 2 refills | Status: DC

## 2020-03-22 MED ORDER — LOSARTAN POTASSIUM 100 MG PO TABS: 100.0000 mg | ORAL_TABLET | Freq: Every day | ORAL | 0 refills | Status: DC

## 2020-03-22 MED ORDER — PROGESTERONE 200 MG PO CAPS: 200.0000 mg | ORAL_CAPSULE | Freq: Every day | ORAL | 3 refills | Status: DC

## 2020-03-22 MED ORDER — CHLORTHALIDONE 25 MG PO TABS: 25.0000 mg | ORAL_TABLET | Freq: Every day | ORAL | 0 refills | Status: DC

## 2020-03-22 MED ORDER — AMLODIPINE BESYLATE 10 MG PO TABS: 10.0000 mg | ORAL_TABLET | Freq: Every day | ORAL | 1 refills | Status: DC

## 2020-03-22 MED FILL — CHLORTHALIDONE 25 MG TABS: 25 | 90 days supply | Qty: 90 | Fill #0

## 2020-03-22 MED FILL — METFORMIN HCL 500 MG TABS: 500 | 90 days supply | Qty: 45 | Fill #0

## 2020-03-22 MED FILL — PROGESTERONE MICRONIZED 200: 200 | 30 days supply | Qty: 30 | Fill #0

## 2020-03-23 NOTE — Telephone Encounter (Signed)
Done

## 2020-03-24 MED FILL — AMLODIPINE BESYLATE 10 MG T: 10 | 90 days supply | Qty: 90 | Fill #0
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
## 2020-03-28 ENCOUNTER — Ambulatory Visit: Payer: 59 | Admitting: Internal Medicine

## 2020-04-11 ENCOUNTER — Ambulatory Visit (INDEPENDENT_AMBULATORY_CARE_PROVIDER_SITE_OTHER): Payer: No Typology Code available for payment source | Admitting: Internal Medicine

## 2020-04-11 ENCOUNTER — Encounter (INDEPENDENT_AMBULATORY_CARE_PROVIDER_SITE_OTHER): Payer: Self-pay | Admitting: Internal Medicine

## 2020-04-11 ENCOUNTER — Other Ambulatory Visit: Payer: Self-pay

## 2020-04-11 ENCOUNTER — Other Ambulatory Visit (INDEPENDENT_AMBULATORY_CARE_PROVIDER_SITE_OTHER): Payer: Self-pay | Admitting: Internal Medicine

## 2020-04-11 VITALS — BP 132/71 | HR 77 | Temp 97.6°F | Resp 18 | Ht 61.0 in | Wt 243.0 lb

## 2020-04-11 DIAGNOSIS — N951 Menopausal and female climacteric states: Secondary | ICD-10-CM | POA: Diagnosis not present

## 2020-04-11 DIAGNOSIS — I10 Essential (primary) hypertension: Secondary | ICD-10-CM | POA: Diagnosis not present

## 2020-04-11 DIAGNOSIS — E559 Vitamin D deficiency, unspecified: Secondary | ICD-10-CM | POA: Diagnosis not present

## 2020-04-11 MED ORDER — MONTELUKAST SODIUM 10 MG PO TABS
10.0000 mg | ORAL_TABLET | Freq: Every day | ORAL | 5 refills | Status: DC
Start: 2020-04-11 — End: 2020-04-11

## 2020-04-11 MED ORDER — CETIRIZINE HCL 10 MG PO TABS: 10.0000 mg | ORAL_TABLET | Freq: Every day | ORAL | 5 refills | Status: DC

## 2020-04-11 MED ORDER — FAMOTIDINE 20 MG PO TABS: 20.0000 mg | ORAL_TABLET | Freq: Every day | ORAL | 1 refills | Status: DC

## 2020-04-11 MED FILL — MONTELUKAST SOD 10 MG TAB: 10 | 30 days supply | Qty: 30 | Fill #0

## 2020-04-11 MED FILL — FAMOTIDINE 20 MG TABS: 20 | 90 days supply | Qty: 90 | Fill #0
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
## 2020-04-26 MED FILL — PROGESTERONE MICRONIZED 200: 200 | 30 days supply | Qty: 30 | Fill #1

## 2020-05-07 ENCOUNTER — Other Ambulatory Visit (HOSPITAL_COMMUNITY): Payer: Self-pay

## 2020-05-17 ENCOUNTER — Other Ambulatory Visit (HOSPITAL_COMMUNITY): Payer: Self-pay

## 2020-05-17 MED FILL — Montelukast Sodium Tab 10 MG (Base Equiv): ORAL | 30 days supply | Qty: 30 | Fill #0 | Status: AC

## 2020-05-18 ENCOUNTER — Other Ambulatory Visit (HOSPITAL_COMMUNITY): Payer: Self-pay

## 2020-05-31 ENCOUNTER — Telehealth (INDEPENDENT_AMBULATORY_CARE_PROVIDER_SITE_OTHER): Payer: Self-pay

## 2020-05-31 NOTE — Telephone Encounter (Signed)
Pt called and said her back & is hurting for a knot in Left side back & left leg hurts. SHE IS HAVING HARD TIME TO GET OFF WORK;FOR [employer]. SO SHE CAN COME TO OV  NEAR LUNCHTIME. PT SAID SHE HAS NOTICE HER WEIGHT GAIN AS WELL; HAVING BLE SWELLING.  FEELING DEPRESSED AND NOT HER BEST SELF AS BEFORE. ASK FOR A APPT . SET FOR 4/27@11 :45

## 2020-06-01 ENCOUNTER — Other Ambulatory Visit: Payer: Self-pay

## 2020-06-01 ENCOUNTER — Encounter (INDEPENDENT_AMBULATORY_CARE_PROVIDER_SITE_OTHER): Payer: Self-pay | Admitting: Internal Medicine

## 2020-06-01 ENCOUNTER — Other Ambulatory Visit (HOSPITAL_COMMUNITY): Payer: Self-pay

## 2020-06-01 ENCOUNTER — Ambulatory Visit (HOSPITAL_COMMUNITY)
Admission: RE | Admit: 2020-06-01 | Discharge: 2020-06-01 | Disposition: A | Discharge status: Home / Self Care | Payer: No Typology Code available for payment source | Source: Ambulatory Visit | Attending: Internal Medicine | Admitting: Internal Medicine

## 2020-06-01 ENCOUNTER — Ambulatory Visit (INDEPENDENT_AMBULATORY_CARE_PROVIDER_SITE_OTHER): Payer: No Typology Code available for payment source | Admitting: Internal Medicine

## 2020-06-01 VITALS — BP 128/90 | HR 78 | Temp 96.8°F | Resp 18 | Ht 61.0 in | Wt 242.0 lb

## 2020-06-01 DIAGNOSIS — R609 Edema, unspecified: Secondary | ICD-10-CM | POA: Diagnosis not present

## 2020-06-01 DIAGNOSIS — M545 Low back pain, unspecified: Secondary | ICD-10-CM

## 2020-06-01 MED ORDER — HYDROCHLOROTHIAZIDE 25 MG PO TABS
25.0000 mg | ORAL_TABLET | Freq: Every day | ORAL | 1 refills | Status: DC
  Filled 2020-06-01: qty 90, 90d supply, fill #0

## 2020-06-01 MED FILL — Progesterone Cap 200 MG: ORAL | 30 days supply | Qty: 30 | Fill #0 | Status: AC
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
## 2020-06-02 ENCOUNTER — Other Ambulatory Visit (INDEPENDENT_AMBULATORY_CARE_PROVIDER_SITE_OTHER): Payer: Self-pay | Admitting: Internal Medicine

## 2020-06-02 ENCOUNTER — Telehealth (INDEPENDENT_AMBULATORY_CARE_PROVIDER_SITE_OTHER): Payer: Self-pay

## 2020-06-02 MED ORDER — CYCLOBENZAPRINE HCL 5 MG PO TABS: 5.0000 mg | ORAL_TABLET | Freq: Three times a day (TID) | ORAL | 1 refills | Status: DC | PRN

## 2020-06-02 NOTE — Telephone Encounter (Signed)
Please let the patient know that we do not have the report of that x-ray yet but I am going to send a muscle relaxant to [facility] in [place] to see if this will help her back pain.  She is to start taking it soon.  I wrote it for 3 times a day but may be she starts taking it just once a day.  This medication can make her sleepy and drowsy and have a dry mouth so she should always take it when she is at home and not driving a car.

## 2020-06-02 NOTE — Telephone Encounter (Signed)
Called patient and gave her the message from Dr. Anastasio Champion. I let her know that the prescription has been sent to her pharmacy and the instructions to start. Patient verbalized an understanding and thanked [place].

## 2020-06-02 NOTE — Telephone Encounter (Signed)
Patient called and left a detailed voice message that she was calling to find out her xray results. Patient stated that she is really having a hard time with her back pain and not sure if you can recommend something to help her be more comfortable. Please advise.

## 2020-06-09 ENCOUNTER — Encounter (INDEPENDENT_AMBULATORY_CARE_PROVIDER_SITE_OTHER): Payer: Self-pay | Admitting: Nurse Practitioner

## 2020-06-09 ENCOUNTER — Ambulatory Visit (INDEPENDENT_AMBULATORY_CARE_PROVIDER_SITE_OTHER): Payer: No Typology Code available for payment source | Admitting: Nurse Practitioner

## 2020-06-09 ENCOUNTER — Other Ambulatory Visit: Payer: Self-pay

## 2020-06-09 VITALS — BP 126/84 | HR 89 | Temp 97.9°F | Ht 61.0 in | Wt 241.4 lb

## 2020-06-09 DIAGNOSIS — L299 Pruritus, unspecified: Secondary | ICD-10-CM | POA: Diagnosis not present

## 2020-06-09 DIAGNOSIS — L659 Nonscarring hair loss, unspecified: Secondary | ICD-10-CM | POA: Diagnosis not present
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
## 2020-06-10 LAB — COMPLETE METABOLIC PANEL WITH GFR
AG Ratio: 1 (calc) (ref 1.0–2.5)
ALT: 11 U/L (ref 6–29)
AST: 12 U/L (ref 10–35)
Albumin: 3.9 g/dL (ref 3.6–5.1)
Alkaline phosphatase (APISO): 104 U/L (ref 37–153)
BUN: 16 mg/dL (ref 7–25)
CO2: 32 mmol/L (ref 20–32)
Calcium: 9.7 mg/dL (ref 8.6–10.4)
Chloride: 99 mmol/L (ref 98–110)
Creat: 0.71 mg/dL (ref 0.50–1.05)
GFR, Est African American: 113 mL/min/{1.73_m2} (ref >=60)
GFR, Est Non African American: 97 mL/min/{1.73_m2} (ref >=60)
Globulin: 3.9 g/dL (calc) — ABNORMAL HIGH (ref 1.9–3.7)
Glucose, Bld: 83 mg/dL (ref 65–99)
Potassium: 3.5 mmol/L (ref 3.5–5.3)
Sodium: 139 mmol/L (ref 135–146)
Total Bilirubin: 0.3 mg/dL (ref 0.2–1.2)
Total Protein: 7.8 g/dL (ref 6.1–8.1)

## 2020-06-10 LAB — T3, FREE: T3, Free: 3 pg/mL (ref 2.3–4.2)

## 2020-06-10 LAB — T4, FREE: Free T4: 1.1 ng/dL (ref 0.8–1.8)

## 2020-06-10 LAB — TSH: TSH: 0.41 mIU/L

## 2020-06-22 ENCOUNTER — Ambulatory Visit (INDEPENDENT_AMBULATORY_CARE_PROVIDER_SITE_OTHER): Payer: No Typology Code available for payment source | Admitting: Internal Medicine

## 2020-06-22 ENCOUNTER — Encounter: Payer: Self-pay | Admitting: Orthopedic Surgery

## 2020-06-22 ENCOUNTER — Encounter (INDEPENDENT_AMBULATORY_CARE_PROVIDER_SITE_OTHER): Payer: Self-pay | Admitting: Internal Medicine

## 2020-06-22 ENCOUNTER — Other Ambulatory Visit: Payer: Self-pay

## 2020-06-22 VITALS — BP 130/88 | HR 88 | Temp 97.3°F | Resp 18 | Ht 61.0 in | Wt 238.0 lb

## 2020-06-22 DIAGNOSIS — M25562 Pain in left knee: Secondary | ICD-10-CM | POA: Diagnosis not present

## 2020-06-22 DIAGNOSIS — L659 Nonscarring hair loss, unspecified: Secondary | ICD-10-CM

## 2020-06-22 DIAGNOSIS — G8929 Other chronic pain: Secondary | ICD-10-CM
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
## 2020-06-27 ENCOUNTER — Other Ambulatory Visit (HOSPITAL_COMMUNITY): Payer: Self-pay

## 2020-06-27 MED FILL — Montelukast Sodium Tab 10 MG (Base Equiv): ORAL | 30 days supply | Qty: 30 | Fill #1 | Status: AC

## 2020-07-07 ENCOUNTER — Other Ambulatory Visit (HOSPITAL_COMMUNITY): Payer: Self-pay

## 2020-07-07 MED FILL — Progesterone Cap 200 MG: ORAL | 30 days supply | Qty: 30 | Fill #1 | Status: AC

## 2020-07-12 ENCOUNTER — Ambulatory Visit: Payer: No Typology Code available for payment source | Admitting: Orthopedic Surgery

## 2020-07-13 ENCOUNTER — Ambulatory Visit (INDEPENDENT_AMBULATORY_CARE_PROVIDER_SITE_OTHER): Payer: No Typology Code available for payment source | Admitting: Orthopedic Surgery

## 2020-07-13 ENCOUNTER — Ambulatory Visit: Payer: No Typology Code available for payment source

## 2020-07-13 ENCOUNTER — Encounter: Payer: Self-pay | Admitting: Orthopedic Surgery

## 2020-07-13 ENCOUNTER — Other Ambulatory Visit: Payer: Self-pay

## 2020-07-13 VITALS — BP 127/89 | HR 95 | Ht 61.0 in | Wt 242.6 lb

## 2020-07-13 DIAGNOSIS — M1711 Unilateral primary osteoarthritis, right knee: Secondary | ICD-10-CM

## 2020-07-13 DIAGNOSIS — M1712 Unilateral primary osteoarthritis, left knee: Secondary | ICD-10-CM | POA: Diagnosis not present

## 2020-07-13 DIAGNOSIS — M25562 Pain in left knee: Secondary | ICD-10-CM

## 2020-07-13 DIAGNOSIS — G8929 Other chronic pain: Secondary | ICD-10-CM

## 2020-07-13 DIAGNOSIS — Z6841 Body Mass Index (BMI) 40.0 and over, adult: Secondary | ICD-10-CM | POA: Diagnosis not present
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
## 2020-07-13 NOTE — Patient Instructions (Addendum)

## 2020-07-18 ENCOUNTER — Ambulatory Visit (INDEPENDENT_AMBULATORY_CARE_PROVIDER_SITE_OTHER): Payer: No Typology Code available for payment source | Admitting: Internal Medicine

## 2020-07-18 ENCOUNTER — Other Ambulatory Visit (INDEPENDENT_AMBULATORY_CARE_PROVIDER_SITE_OTHER): Payer: Self-pay | Admitting: Internal Medicine

## 2020-08-10 ENCOUNTER — Telehealth (INDEPENDENT_AMBULATORY_CARE_PROVIDER_SITE_OTHER): Payer: Self-pay

## 2020-08-10 ENCOUNTER — Other Ambulatory Visit (INDEPENDENT_AMBULATORY_CARE_PROVIDER_SITE_OTHER): Payer: Self-pay | Admitting: Internal Medicine

## 2020-08-10 ENCOUNTER — Other Ambulatory Visit (HOSPITAL_COMMUNITY): Payer: Self-pay

## 2020-08-10 MED ORDER — PROGESTERONE 200 MG PO CAPS
200.0000 mg | ORAL_CAPSULE | Freq: Every day | ORAL | 0 refills | Status: DC
  Filled 2020-08-10: qty 30, 30d supply, fill #0

## 2020-08-10 MED FILL — Amlodipine Besylate Tab 10 MG (Base Equivalent): ORAL | 90 days supply | Qty: 90 | Fill #0 | Status: AC

## 2020-08-10 MED FILL — Montelukast Sodium Tab 10 MG (Base Equiv): ORAL | 30 days supply | Qty: 30 | Fill #2 | Status: AC

## 2020-08-10 MED FILL — Famotidine Tab 20 MG: ORAL | 90 days supply | Qty: 90 | Fill #0 | Status: AC

## 2020-08-10 NOTE — Telephone Encounter (Signed)
Patient called and left a VM requesting a refill of the following medication to be sent to [facility]:  Progesterone  Medication has already been sent to her pharmacy today.

## 2020-08-26 ENCOUNTER — Telehealth: Payer: Self-pay | Admitting: Orthopedic Surgery

## 2020-08-26 NOTE — Telephone Encounter (Signed)
Call received from patient via voice message left at 11:48am, asking about possibly returning for another appointment for her knee problem (last seen by Dr Amedeo Kinsman 07/13/20); also mentioned on message, may need a prescription called in. I returned call to further discuss, and phone went to voice mail. Patient's voice message was full; unable to leave message.

## 2020-08-29 ENCOUNTER — Ambulatory Visit (INDEPENDENT_AMBULATORY_CARE_PROVIDER_SITE_OTHER): Payer: No Typology Code available for payment source | Admitting: Internal Medicine

## 2020-08-29 ENCOUNTER — Encounter (INDEPENDENT_AMBULATORY_CARE_PROVIDER_SITE_OTHER): Payer: Self-pay | Admitting: Internal Medicine

## 2020-08-29 ENCOUNTER — Other Ambulatory Visit: Payer: Self-pay

## 2020-08-29 ENCOUNTER — Telehealth: Payer: Self-pay | Admitting: Orthopedic Surgery

## 2020-08-29 DIAGNOSIS — G8929 Other chronic pain: Secondary | ICD-10-CM | POA: Diagnosis not present

## 2020-08-29 DIAGNOSIS — M25562 Pain in left knee: Secondary | ICD-10-CM | POA: Diagnosis not present

## 2020-08-29 MED ORDER — DICLOFENAC SODIUM 50 MG PO TBEC: 50.0000 mg | DELAYED_RELEASE_TABLET | Freq: Two times a day (BID) | ORAL | 2 refills | Status: DC
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
## 2020-08-29 NOTE — Telephone Encounter (Signed)
Patient is requesting something for pain.  She has been taking Ibuprofen but this has not helped.  She is using [employer]

## 2020-09-06 IMAGING — US US BREAST*L* LIMITED INC AXILLA
1 series · 13 of 15 positions shown · non-contrast
Comparison: Previous exam(s).

CLINICAL DATA: Follow-up of a probable benign left axillary lymph
node. Patient complains of a superficial palpable abnormality in the
left axilla that has been stable and present for 1 year.

EXAM:
DIGITAL DIAGNOSTIC BILATERAL MAMMOGRAM WITH CAD AND TOMO
ULTRASOUND LEFT BREAST

[Series 1: us breast*left* limited inc axilla · 0.05mm/px · 13 of 15 slices shown]
[im 1/15]
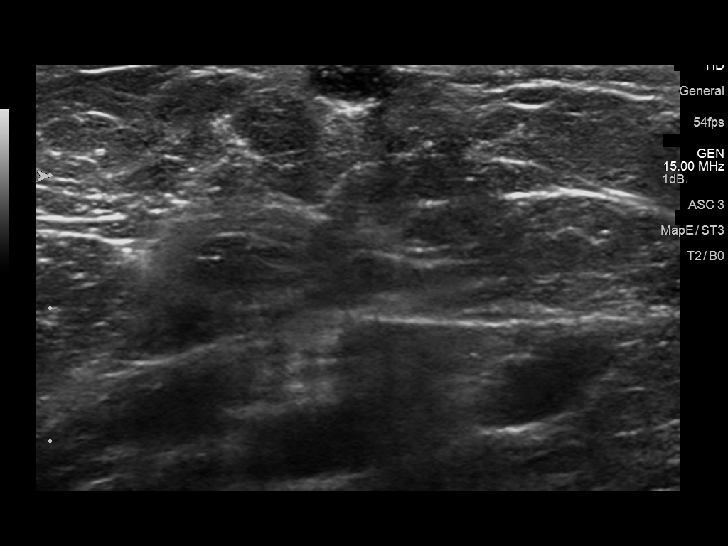
[im 2/15]
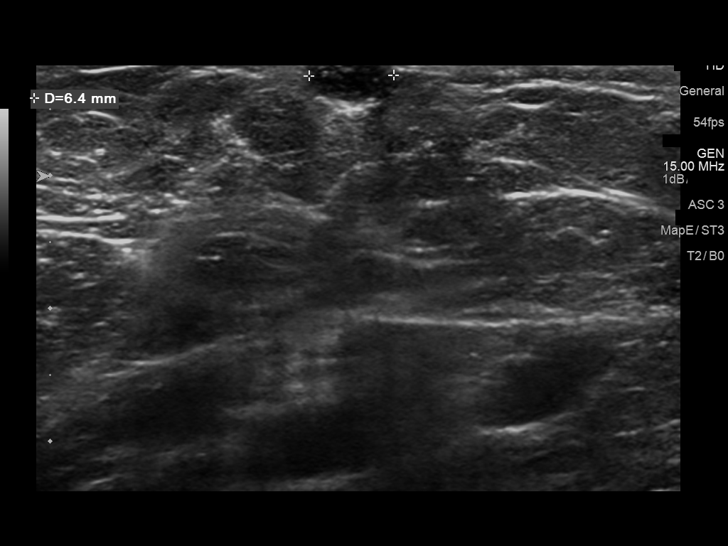
[im 3/15]
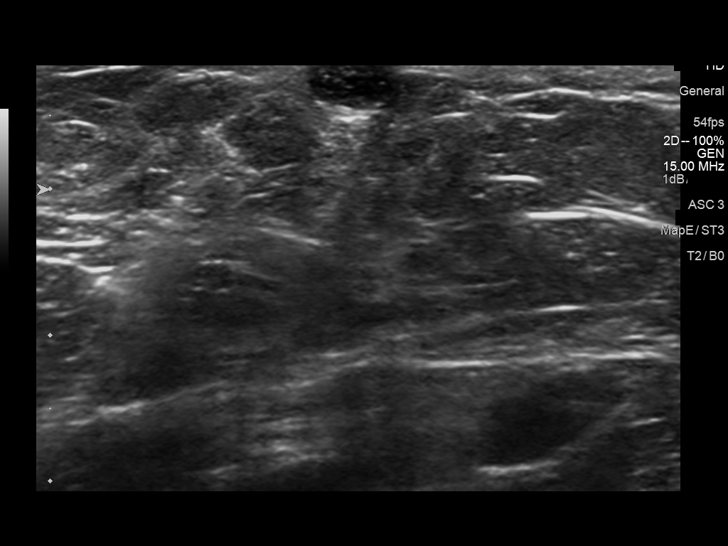
[im 5/15]
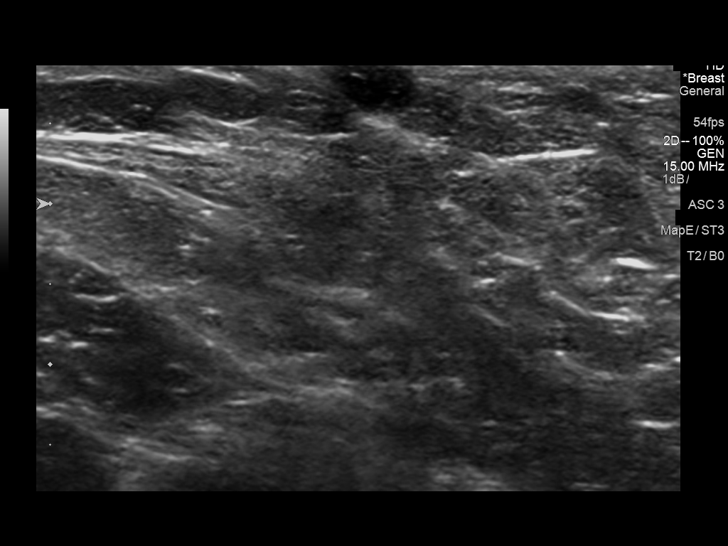
[im 6/15]
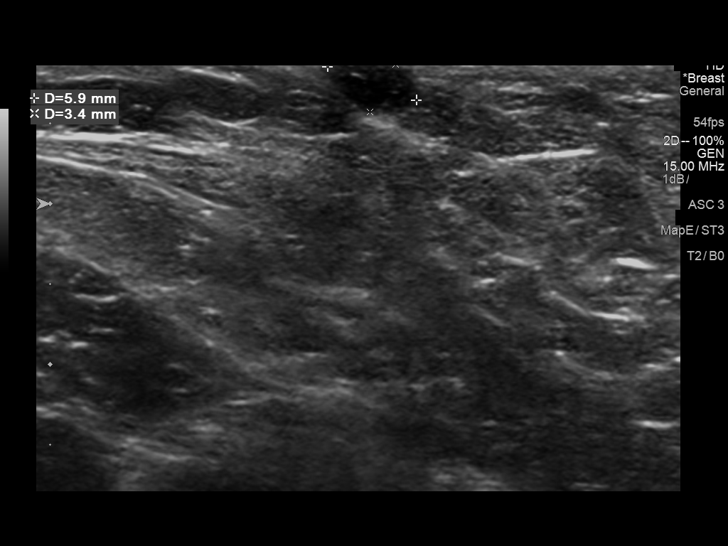
[im 7/15]
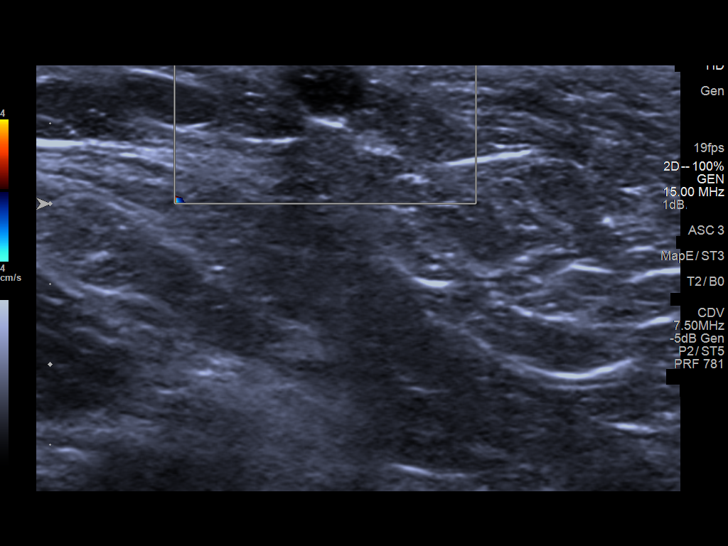
[im 8/15]
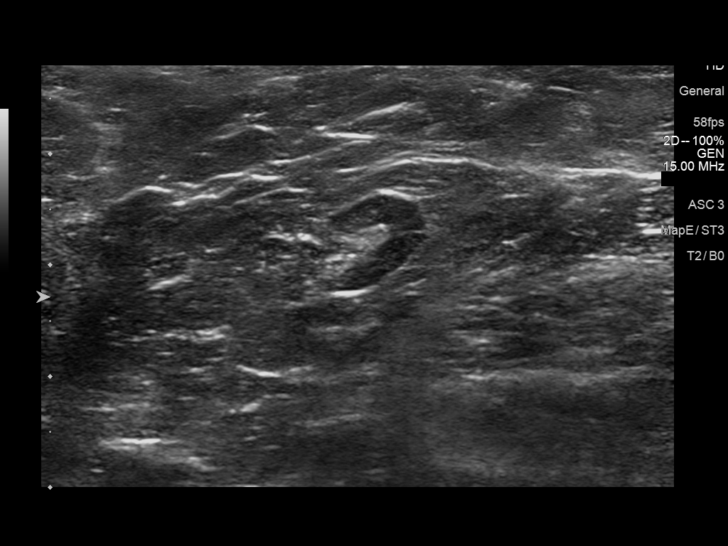
[im 9/15]
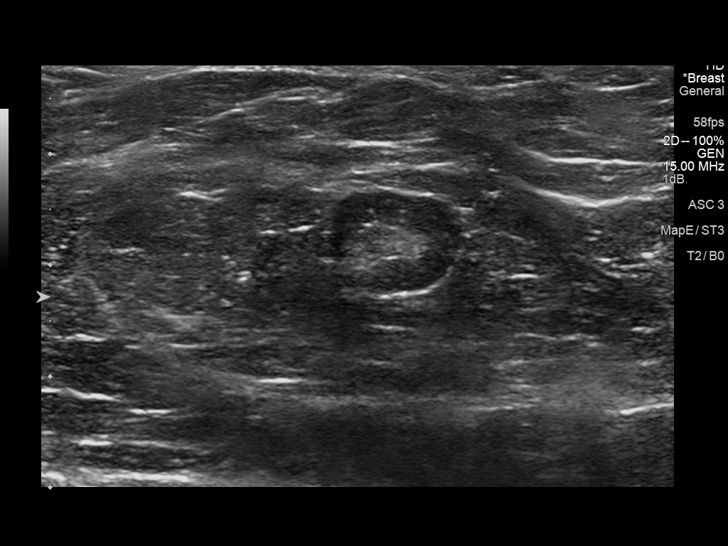
[im 10/15]
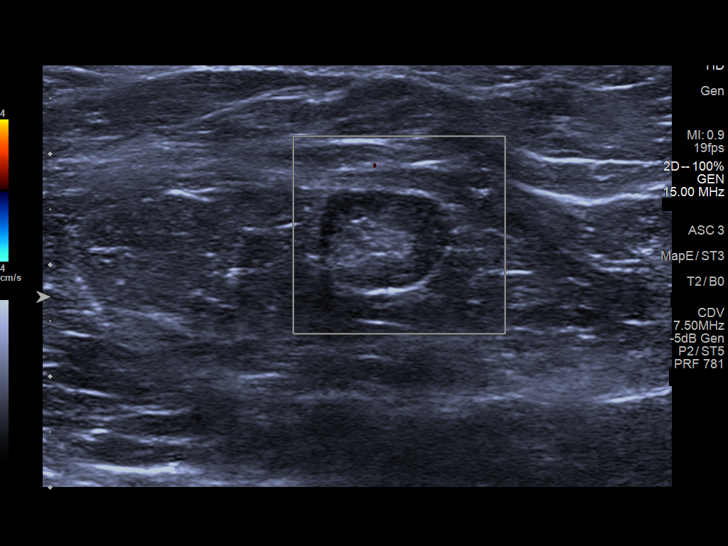
[im 11/15]
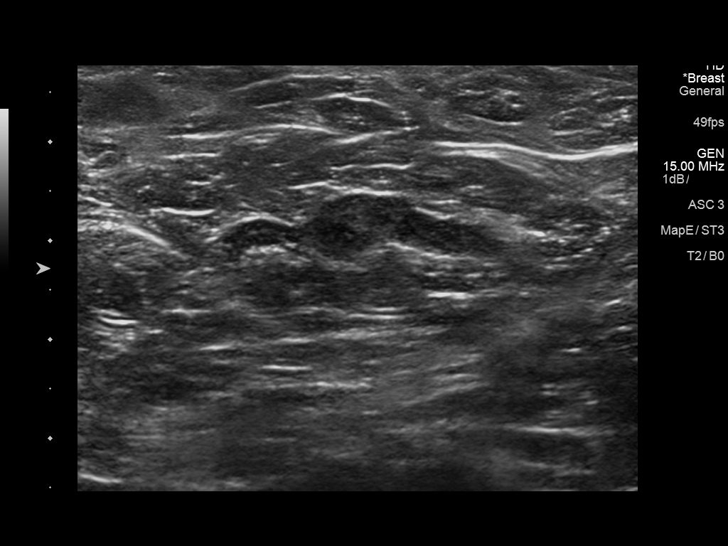
[im 13/15]
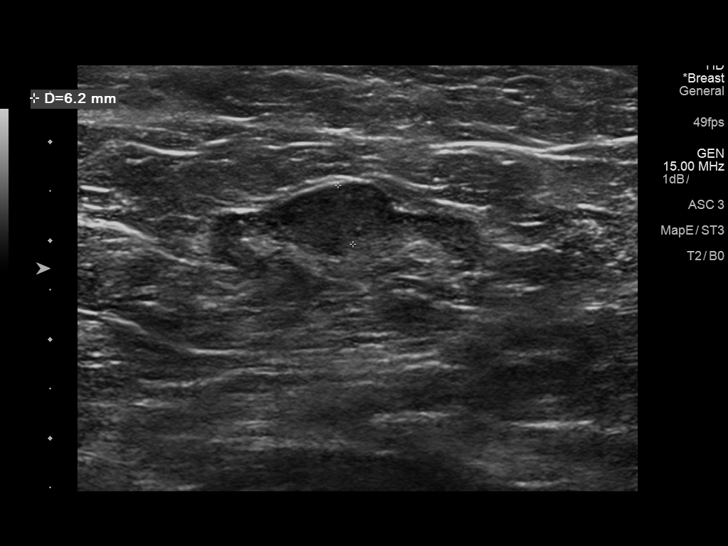
[im 14/15]
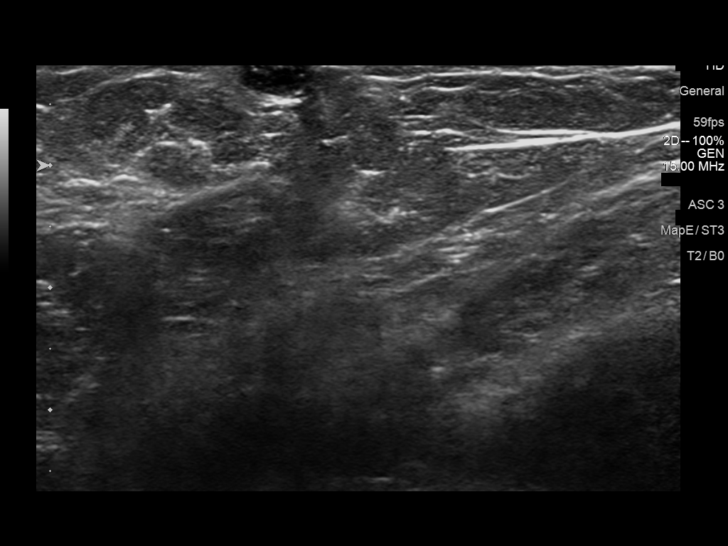
[im 15/15]
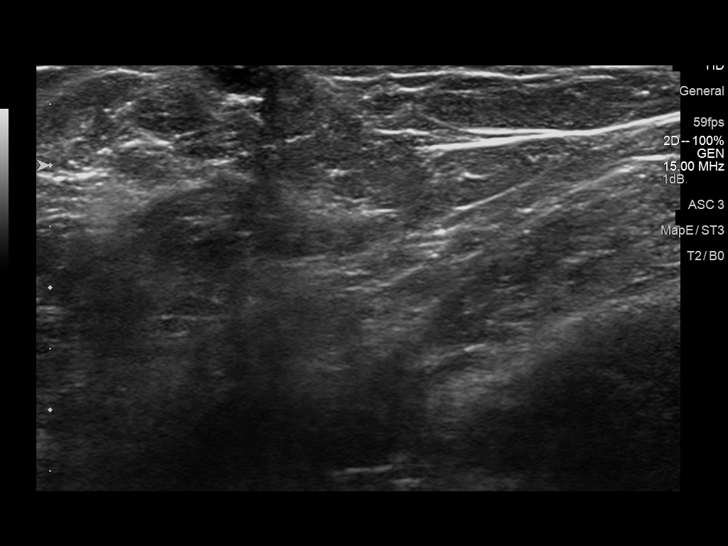

[13 of 15 positions shown; findings below may reference images not displayed]

ACR Breast Density Category c: The breast tissue is heterogeneously
dense, which may obscure small masses.
FINDINGS: No suspicious mass, malignant type microcalcifications or distortion
detected in either breast.

Mammographic images were processed with CAD.

On physical exam, I palpate a superficial BB sized mass in the left
axilla.

Targeted ultrasound is performed, showing a complex cyst in the skin
of the left axilla with a tract to the skin surface measuring 6 mm.
It is consistent with a sebaceous cyst. There is a left axillary
lymph node with the cortex measuring 6 mm. It is stable compared to
the prior ultrasound dated 12/07/2014.
IMPRESSION: No evidence of malignancy in either breast.

RECOMMENDATION:
Bilateral screening mammogram in 1 year is recommended.

I have discussed the findings and recommendations with the patient.
Results were also provided in writing at the conclusion of the
visit. If applicable, a reminder letter will be sent to the patient
regarding the next appointment.

BI-RADS CATEGORY  2: Benign.

## 2020-09-21 ENCOUNTER — Ambulatory Visit (INDEPENDENT_AMBULATORY_CARE_PROVIDER_SITE_OTHER): Payer: No Typology Code available for payment source | Admitting: Internal Medicine

## 2020-09-27 ENCOUNTER — Ambulatory Visit (INDEPENDENT_AMBULATORY_CARE_PROVIDER_SITE_OTHER): Payer: No Typology Code available for payment source | Admitting: Orthopedic Surgery

## 2020-09-27 ENCOUNTER — Other Ambulatory Visit: Payer: Self-pay

## 2020-09-27 ENCOUNTER — Encounter: Payer: Self-pay | Admitting: Orthopedic Surgery

## 2020-09-27 VITALS — BP 149/90 | HR 73 | Ht 61.0 in | Wt 237.0 lb

## 2020-09-27 DIAGNOSIS — M1712 Unilateral primary osteoarthritis, left knee: Secondary | ICD-10-CM | POA: Diagnosis not present

## 2020-09-27 NOTE — Patient Instructions (Signed)
Voltaren gel for knee  Can get another injection after 9/8

## 2020-09-27 NOTE — Progress Notes (Signed)
Orthopaedic Clinic Return  Assessment: Kristina Bonilla is a 55 y.o. female with the following: Left knee arthritis   Plan: Patient had an injection in her left knee, less than 3 months ago, with improvement in her symptoms.  However, her symptoms have progressively worsened.  She is interested in another injection, but it is too soon.  This was expressed to the patient, and she stated her understanding.  In the interim, I recommended 800 mg of ibuprofen, every 8 hours as needed.  She can also alternate Tylenol in between dosing, and have also recommended Voltaren gel over the medial aspect of the left knee.  If she continues to have difficulty in another 2-3 weeks, she should contact the clinic and we can schedule an appointment to repeat the injection.  We also briefly discussed her weight/BMI, and how this could prevent her from surgery if the arthritis continues to progress.  The patient meets the AMA guidelines for Morbid obesity with BMI > 40.  The patient has been counseled on weight loss.    Follow-up: Return if symptoms worsen or fail to improve.   Subjective:  Chief Complaint  Patient presents with   Knee Pain    Left/ knee painful worse than right states Diclofenac not helping     History of Present Illness: Kristina Bonilla is a 55 y.o. female who returns to clinic for repeat evaluation of her left knee.  She was seen in clinic just over 2 months ago, and diagnosed with arthritis.  She received a steroid injection in her left knee, which did improve her symptoms, but the pain is returned.  She is currently taking ibuprofen 800 mg, every 4 hours.  She denies any GI issues.  She notes pain in the medial aspect of the left knee.  She also has pain in the posterior aspect of her knee when she is in a seated position.  She continues to walk as much as possible.  She has been doing some home exercises for the knee.  Mild symptoms in her right knee, but nothing is changed since last visit.   She states she has some radiating pains distally towards her foot.  She has also noticed some swelling in the left foot and ankle recently.  Review of Systems: No fevers or chills No numbness or tingling No chest pain No shortness of breath No bowel or bladder dysfunction No GI distress No headaches   Objective: BP (!) 149/90   Pulse 73   Ht '5\' 1"'$  (1.549 m)   Wt 237 lb (107.5 kg)   BMI 44.78 kg/m   Physical Exam:  Alert and oriented.  No acute distress.  Evaluation left knee demonstrates a varus alignment overall.  She does have a Baker's cyst in the posterior aspect of her knee.  Tenderness to palpation along the medial joint line.  Range of motion from 0-100 degrees.  Toes are warm and well-perfused.  Sensation is intact throughout the lower extremity.  IMAGING: I personally ordered and reviewed the following images:  No new imaging obtained today.  Mordecai Rasmussen, MD 09/27/2020 2:09 PM

## 2020-10-04 ENCOUNTER — Ambulatory Visit (INDEPENDENT_AMBULATORY_CARE_PROVIDER_SITE_OTHER): Payer: No Typology Code available for payment source | Admitting: Internal Medicine

## 2020-10-06 ENCOUNTER — Encounter (INDEPENDENT_AMBULATORY_CARE_PROVIDER_SITE_OTHER): Payer: 59 | Admitting: Nurse Practitioner

## 2020-10-20 ENCOUNTER — Other Ambulatory Visit (HOSPITAL_COMMUNITY): Payer: Self-pay | Admitting: Internal Medicine

## 2020-10-20 DIAGNOSIS — Z1231 Encounter for screening mammogram for malignant neoplasm of breast: Secondary | ICD-10-CM

## 2020-11-07 ENCOUNTER — Encounter (HOSPITAL_COMMUNITY): Payer: Self-pay | Admitting: Psychiatry

## 2020-11-07 ENCOUNTER — Telehealth (INDEPENDENT_AMBULATORY_CARE_PROVIDER_SITE_OTHER): Payer: No Typology Code available for payment source | Admitting: Psychiatry

## 2020-11-07 DIAGNOSIS — F331 Major depressive disorder, recurrent, moderate: Secondary | ICD-10-CM

## 2020-11-07 DIAGNOSIS — F4310 Post-traumatic stress disorder, unspecified: Secondary | ICD-10-CM

## 2020-11-07 MED ORDER — ESCITALOPRAM OXALATE 10 MG PO TABS: 10.0000 mg | ORAL_TABLET | Freq: Every day | ORAL | 0 refills | Status: DC
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
## 2020-11-17 ENCOUNTER — Other Ambulatory Visit (INDEPENDENT_AMBULATORY_CARE_PROVIDER_SITE_OTHER): Payer: Self-pay | Admitting: Nurse Practitioner

## 2020-11-17 ENCOUNTER — Encounter: Payer: Self-pay | Admitting: Internal Medicine

## 2020-11-17 ENCOUNTER — Ambulatory Visit (INDEPENDENT_AMBULATORY_CARE_PROVIDER_SITE_OTHER): Payer: No Typology Code available for payment source | Admitting: Internal Medicine

## 2020-11-17 ENCOUNTER — Other Ambulatory Visit: Payer: Self-pay

## 2020-11-17 ENCOUNTER — Other Ambulatory Visit (HOSPITAL_COMMUNITY): Payer: Self-pay

## 2020-11-17 VITALS — BP 172/92 | HR 88 | Temp 98.3°F | Ht 61.0 in | Wt 234.1 lb

## 2020-11-17 DIAGNOSIS — Z7689 Persons encountering health services in other specified circumstances: Secondary | ICD-10-CM

## 2020-11-17 DIAGNOSIS — R7303 Prediabetes: Secondary | ICD-10-CM | POA: Diagnosis not present

## 2020-11-17 DIAGNOSIS — F331 Major depressive disorder, recurrent, moderate: Secondary | ICD-10-CM | POA: Insufficient documentation

## 2020-11-17 DIAGNOSIS — F321 Major depressive disorder, single episode, moderate: Secondary | ICD-10-CM

## 2020-11-17 DIAGNOSIS — J3089 Other allergic rhinitis: Secondary | ICD-10-CM

## 2020-11-17 DIAGNOSIS — I10 Essential (primary) hypertension: Secondary | ICD-10-CM

## 2020-11-17 DIAGNOSIS — K219 Gastro-esophageal reflux disease without esophagitis: Secondary | ICD-10-CM

## 2020-11-17 DIAGNOSIS — D179 Benign lipomatous neoplasm, unspecified: Secondary | ICD-10-CM | POA: Insufficient documentation

## 2020-11-17 DIAGNOSIS — D1779 Benign lipomatous neoplasm of other sites: Secondary | ICD-10-CM

## 2020-11-17 MED ORDER — HYDROCHLOROTHIAZIDE 25 MG PO TABS
25.0000 mg | ORAL_TABLET | Freq: Every day | ORAL | 1 refills | Status: DC
  Filled 2020-11-17: qty 90, 90d supply, fill #0
  Filled 2021-05-04: qty 90, 90d supply, fill #1

## 2020-11-17 MED ORDER — LOSARTAN POTASSIUM 50 MG PO TABS
50.0000 mg | ORAL_TABLET | Freq: Every day | ORAL | 1 refills | Status: DC
  Filled 2020-11-17: qty 90, 90d supply, fill #0
  Filled 2021-05-04: qty 90, 90d supply, fill #1

## 2020-11-17 MED ORDER — AMLODIPINE BESYLATE 10 MG PO TABS
10.0000 mg | ORAL_TABLET | Freq: Every day | ORAL | 1 refills | Status: DC
  Filled 2020-11-17: qty 90, 90d supply, fill #0
  Filled 2021-05-04: qty 90, 90d supply, fill #1

## 2020-11-17 NOTE — Assessment & Plan Note (Signed)
On Pepcid 20 mg daily 

## 2020-11-17 NOTE — Assessment & Plan Note (Signed)
Well controlled with Zyrtec and Singulair

## 2020-11-17 NOTE — Assessment & Plan Note (Signed)
BP Readings from Last 1 Encounters:  11/17/20 (!) 172/92   uncontrolled as she ran out of her BP medications Was on amlodipine 10 mg daily, losartan 50 mg daily and HCTZ 25 mg daily Counseled for compliance with the medications Advised DASH diet and moderate exercise/walking, at least 150 mins/week

## 2020-11-17 NOTE — Assessment & Plan Note (Signed)
Advised to follow low-carb diet for now Was on metformin for prediabetes in the past Would prefer to start GLP-1 agonist for added benefit of weight loss, check HbA1c

## 2020-11-17 NOTE — Assessment & Plan Note (Signed)
Mass over back region appears to be likely lipoma, nontender Reassured patient about its benign nature Advised to contact if it is growing in size or painful
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
## 2020-11-17 NOTE — Assessment & Plan Note (Signed)
Well controlled with Lexapro 10 mg daily

## 2020-11-17 NOTE — Assessment & Plan Note (Signed)
Care established Previous chart reviewed History and medications reviewed with the patient 

## 2020-11-17 NOTE — Assessment & Plan Note (Signed)
Check HbA1c Plan to start GLP-1 agonist for added benefit of weight loss

## 2020-11-17 NOTE — Patient Instructions (Signed)
Please start taking medications as prescribed.  Please get fasting blood tests done within a week.  Please follow low carb diet and perform moderate exercise/walking at least 150 mins/week.

## 2020-11-18 ENCOUNTER — Other Ambulatory Visit (HOSPITAL_COMMUNITY): Payer: Self-pay

## 2020-11-18 ENCOUNTER — Other Ambulatory Visit (INDEPENDENT_AMBULATORY_CARE_PROVIDER_SITE_OTHER): Payer: Self-pay | Admitting: Internal Medicine

## 2020-11-18 MED ORDER — PROGESTERONE 200 MG PO CAPS
200.0000 mg | ORAL_CAPSULE | Freq: Every day | ORAL | 0 refills | Status: DC
  Filled 2020-11-18: qty 30, 30d supply, fill #0

## 2020-11-22 ENCOUNTER — Other Ambulatory Visit: Payer: Self-pay | Admitting: Internal Medicine

## 2020-11-22 DIAGNOSIS — E0590 Thyrotoxicosis, unspecified without thyrotoxic crisis or storm: Secondary | ICD-10-CM

## 2020-11-22 LAB — CMP14+EGFR
ALT: 10 IU/L (ref 0–32)
AST: 18 IU/L (ref 0–40)
Albumin/Globulin Ratio: 1.2 (ref 1.2–2.2)
Albumin: 4.3 g/dL (ref 3.8–4.9)
Alkaline Phosphatase: 98 IU/L (ref 44–121)
BUN/Creatinine Ratio: 15 (ref 9–23)
BUN: 12 mg/dL (ref 6–24)
Bilirubin Total: 0.4 mg/dL (ref 0.0–1.2)
CO2: 26 mmol/L (ref 20–29)
Calcium: 9.9 mg/dL (ref 8.7–10.2)
Chloride: 99 mmol/L (ref 96–106)
Creatinine, Ser: 0.81 mg/dL (ref 0.57–1.00)
Globulin, Total: 3.5 g/dL (ref 1.5–4.5)
Glucose: 87 mg/dL (ref 70–99)
Potassium: 4.2 mmol/L (ref 3.5–5.2)
Sodium: 140 mmol/L (ref 134–144)
Total Protein: 7.8 g/dL (ref 6.0–8.5)
eGFR: 87 mL/min/{1.73_m2} (ref >59)

## 2020-11-22 LAB — CBC WITH DIFFERENTIAL/PLATELET
Basophils Absolute: 0.1 10*3/uL (ref 0.0–0.2)
Basos: 1 %
EOS (ABSOLUTE): 0.1 10*3/uL (ref 0.0–0.4)
Eos: 1 %
Hematocrit: 42.5 % (ref 34.0–46.6)
Hemoglobin: 13.8 g/dL (ref 11.1–15.9)
Immature Grans (Abs): 0 10*3/uL (ref 0.0–0.1)
Immature Granulocytes: 0 %
Lymphocytes Absolute: 3.1 10*3/uL (ref 0.7–3.1)
Lymphs: 29 %
MCH: 27.9 pg (ref 26.6–33.0)
MCHC: 32.5 g/dL (ref 31.5–35.7)
MCV: 86 fL (ref 79–97)
Monocytes Absolute: 0.8 10*3/uL (ref 0.1–0.9)
Monocytes: 8 %
Neutrophils Absolute: 6.4 10*3/uL (ref 1.4–7.0)
Neutrophils: 61 %
Platelets: 350 10*3/uL (ref 150–450)
RBC: 4.95 x10E6/uL (ref 3.77–5.28)
RDW: 13 % (ref 11.7–15.4)
WBC: 10.4 10*3/uL (ref 3.4–10.8)

## 2020-11-22 LAB — TSH+FREE T4
Free T4: 1.19 ng/dL (ref 0.82–1.77)
TSH: 0.254 u[IU]/mL — ABNORMAL LOW (ref 0.450–4.500)

## 2020-11-22 LAB — HEMOGLOBIN A1C
Est. average glucose Bld gHb Est-mCnc: 117 mg/dL
Hgb A1c MFr Bld: 5.7 % — ABNORMAL HIGH (ref 4.8–5.6)

## 2020-12-05 ENCOUNTER — Encounter: Payer: Self-pay | Admitting: Nurse Practitioner

## 2020-12-05 ENCOUNTER — Other Ambulatory Visit: Payer: Self-pay

## 2020-12-05 ENCOUNTER — Ambulatory Visit (INDEPENDENT_AMBULATORY_CARE_PROVIDER_SITE_OTHER): Payer: No Typology Code available for payment source | Admitting: Nurse Practitioner

## 2020-12-05 VITALS — BP 114/70 | HR 80 | Ht 61.0 in | Wt 236.0 lb

## 2020-12-05 DIAGNOSIS — E0590 Thyrotoxicosis, unspecified without thyrotoxic crisis or storm: Secondary | ICD-10-CM
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
## 2020-12-05 NOTE — Patient Instructions (Signed)
Hyperthyroidism Hyperthyroidism is when the thyroid gland is too active (overactive). The thyroid gland is a small gland located in the lower front part of the neck, just in front of the windpipe (trachea). This gland makes hormones that help control how the body uses food for energy (metabolism) as well as how the heart and brain function. These hormones also play a role in keeping your bones strong. When the thyroid is overactive, it produces too much of a hormone called thyroxine. What are the causes? This condition may be caused by: Graves' disease. This is a disorder in which the body's disease-fighting system (immune system) attacks the thyroid gland. This is the most common cause. Inflammation of the thyroid gland. A tumor in the thyroid gland. Use of certain medicines, including: Prescription thyroid hormone replacement. Herbal supplements that mimic thyroid hormones. Amiodarone therapy. Solid or fluid-filled lumps within your thyroid gland (thyroid nodules). Taking in a large amount of iodine from foods or medicines. What increases the risk? You are more likely to develop this condition if: You are female. You have a family history of thyroid conditions. You smoke tobacco. You use a medicine called lithium. You take medicines that affect the immune system (immunosuppressants). What are the signs or symptoms? Symptoms of this condition include: Nervousness. Inability to tolerate heat. Unexplained weight loss. Diarrhea. Change in the texture of hair or skin. Heart skipping beats or making extra beats. Rapid heart rate. Loss of menstruation. Shaky hands. Fatigue. Restlessness. Sleep problems. Enlarged thyroid gland or a lump in the thyroid (nodule). You may also have symptoms of Graves' disease, which may include: Protruding eyes. Dry eyes. Red or swollen eyes. Problems with vision. How is this diagnosed? This condition may be diagnosed based on: Your symptoms and  medical history. A physical exam. Blood tests. Thyroid ultrasound. This test involves using sound waves to produce images of the thyroid gland. A thyroid scan. A radioactive substance is injected into a vein, and images show how much iodine is present in the thyroid. Radioactive iodine uptake test (RAIU). A small amount of radioactive iodine is given by mouth to see how much iodine the thyroid absorbs after a certain amount of time. How is this treated? Treatment depends on the cause and severity of the condition. Treatment may include: Medicines to reduce the amount of thyroid hormone your body makes. Radioactive iodine treatment (radioiodine therapy). This involves swallowing a small dose of radioactive iodine, in capsule or liquid form, to kill thyroid cells. Surgery to remove part or all of your thyroid gland. You may need to take thyroid hormone replacement medicine for the rest of your life after thyroid surgery. Medicines to help manage your symptoms. Follow these instructions at home:  Take over-the-counter and prescription medicines only as told by your health care provider. Do not use any products that contain nicotine or tobacco, such as cigarettes and e-cigarettes. If you need help quitting, ask your health care provider. Follow any instructions from your health care provider about diet. You may be instructed to limit foods that contain iodine. Keep all follow-up visits as told by your health care provider. This is important. You will need to have blood tests regularly so that your health care provider can monitor your condition. Contact a health care provider if: Your symptoms do not get better with treatment. You have a fever. You are taking thyroid hormone replacement medicine and you: Have symptoms of depression. Feel like you are tired all the time. Gain weight. Get help right  away if: You have chest pain. You have decreased alertness or a change in your awareness. You  have abdominal pain. You feel dizzy. You have a rapid heartbeat. You have an irregular heartbeat. You have difficulty breathing. Summary The thyroid gland is a small gland located in the lower front part of the neck, just in front of the windpipe (trachea). Hyperthyroidism is when the thyroid gland is too active (overactive) and produces too much of a hormone called thyroxine. The most common cause is Graves' disease, a disorder in which your immune system attacks the thyroid gland. Hyperthyroidism can cause various symptoms, such as unexplained weight loss, nervousness, inability to tolerate heat, or changes in your heartbeat. Treatment may include medicine to reduce the amount of thyroid hormone your body makes, radioiodine therapy, surgery, or medicines to manage symptoms. This information is not intended to replace advice given to you by your health care provider. Make sure you discuss any questions you have with your health care provider. Document Revised: 10/08/2019 Document Reviewed: 10/08/2019 Elsevier Patient Education  2022 [employer].

## 2020-12-06 LAB — T4, FREE: Free T4: 1.06 ng/dL (ref 0.82–1.77)

## 2020-12-06 LAB — THYROID PEROXIDASE ANTIBODY: Thyroperoxidase Ab SerPl-aCnc: 10 IU/mL (ref 0–34)

## 2020-12-06 LAB — THYROGLOBULIN ANTIBODY: Thyroglobulin Antibody: <1 IU/mL (ref 0.0–0.9)

## 2020-12-06 LAB — TSH: TSH: 0.317 u[IU]/mL — ABNORMAL LOW (ref 0.450–4.500)

## 2020-12-06 LAB — T3, FREE: T3, Free: 2.6 pg/mL (ref 2.0–4.4)

## 2020-12-07 ENCOUNTER — Telehealth (HOSPITAL_COMMUNITY): Payer: No Typology Code available for payment source | Admitting: Psychiatry

## 2020-12-07 NOTE — Addendum Note (Signed)
Addended by: Brita Romp on: 12/07/2020 08:07 AM   Modules accepted: Orders

## 2020-12-13 ENCOUNTER — Ambulatory Visit: Payer: No Typology Code available for payment source | Admitting: Nurse Practitioner

## 2020-12-15 ENCOUNTER — Ambulatory Visit (HOSPITAL_COMMUNITY)
Admission: RE | Admit: 2020-12-15 | Discharge: 2020-12-15 | Disposition: A | Discharge status: Home / Self Care | Payer: No Typology Code available for payment source | Source: Ambulatory Visit | Attending: Internal Medicine | Admitting: Internal Medicine

## 2020-12-15 ENCOUNTER — Encounter (HOSPITAL_COMMUNITY): Payer: Self-pay

## 2020-12-15 ENCOUNTER — Other Ambulatory Visit: Payer: Self-pay

## 2020-12-15 DIAGNOSIS — Z1231 Encounter for screening mammogram for malignant neoplasm of breast: Secondary | ICD-10-CM | POA: Insufficient documentation

## 2020-12-19 ENCOUNTER — Other Ambulatory Visit (HOSPITAL_COMMUNITY): Payer: Self-pay | Admitting: Internal Medicine

## 2020-12-19 ENCOUNTER — Other Ambulatory Visit: Payer: Self-pay

## 2020-12-19 ENCOUNTER — Ambulatory Visit (HOSPITAL_COMMUNITY)
Admission: RE | Admit: 2020-12-19 | Discharge: 2020-12-19 | Disposition: A | Discharge status: Home / Self Care | Payer: No Typology Code available for payment source | Source: Ambulatory Visit | Attending: Nurse Practitioner | Admitting: Nurse Practitioner

## 2020-12-19 DIAGNOSIS — N6320 Unspecified lump in the left breast, unspecified quadrant: Secondary | ICD-10-CM

## 2020-12-19 DIAGNOSIS — E0590 Thyrotoxicosis, unspecified without thyrotoxic crisis or storm: Secondary | ICD-10-CM | POA: Insufficient documentation

## 2020-12-22 ENCOUNTER — Ambulatory Visit (HOSPITAL_COMMUNITY)
Admission: RE | Admit: 2020-12-22 | Discharge: 2020-12-22 | Disposition: A | Discharge status: Home / Self Care | Payer: No Typology Code available for payment source | Source: Ambulatory Visit | Attending: Internal Medicine | Admitting: Internal Medicine

## 2020-12-22 ENCOUNTER — Other Ambulatory Visit: Payer: Self-pay

## 2020-12-22 DIAGNOSIS — N6320 Unspecified lump in the left breast, unspecified quadrant: Secondary | ICD-10-CM | POA: Insufficient documentation

## 2020-12-27 ENCOUNTER — Telehealth: Payer: Self-pay

## 2020-12-27 NOTE — Telephone Encounter (Signed)
This medication was last filled by psychiatry do you want to refill?

## 2020-12-27 NOTE — Telephone Encounter (Signed)
Patient called need med refill Escitalopram  Pharmacy: [facility]

## 2020-12-28 ENCOUNTER — Telehealth (HOSPITAL_COMMUNITY): Payer: Self-pay | Admitting: Psychiatry

## 2020-12-28 MED ORDER — ESCITALOPRAM OXALATE 10 MG PO TABS: 10.0000 mg | ORAL_TABLET | Freq: Every day | ORAL | 0 refills | Status: DC

## 2020-12-28 NOTE — Telephone Encounter (Signed)
Sent a one month supply

## 2020-12-28 NOTE — Telephone Encounter (Signed)
Pt notified with verbal understanding  °

## 2020-12-28 NOTE — Telephone Encounter (Signed)
Pt needs a refill  Refill: escitalopram (LEXAPRO) 10 MG tablet  send to: [place] [place] [facility] [place]

## 2021-01-02 ENCOUNTER — Encounter (HOSPITAL_COMMUNITY): Payer: Self-pay | Admitting: Psychiatry

## 2021-01-02 ENCOUNTER — Telehealth (INDEPENDENT_AMBULATORY_CARE_PROVIDER_SITE_OTHER): Payer: No Typology Code available for payment source | Admitting: Psychiatry

## 2021-01-02 DIAGNOSIS — F331 Major depressive disorder, recurrent, moderate: Secondary | ICD-10-CM | POA: Diagnosis not present

## 2021-01-02 DIAGNOSIS — F4310 Post-traumatic stress disorder, unspecified: Secondary | ICD-10-CM | POA: Diagnosis not present

## 2021-01-02 MED ORDER — ESCITALOPRAM OXALATE 10 MG PO TABS: 10.0000 mg | ORAL_TABLET | Freq: Every day | ORAL | 2 refills | Status: DC
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
## 2021-01-03 ENCOUNTER — Encounter: Payer: Self-pay | Admitting: Nurse Practitioner

## 2021-01-03 ENCOUNTER — Ambulatory Visit (INDEPENDENT_AMBULATORY_CARE_PROVIDER_SITE_OTHER): Payer: No Typology Code available for payment source | Admitting: Nurse Practitioner

## 2021-01-03 ENCOUNTER — Other Ambulatory Visit: Payer: Self-pay

## 2021-01-03 VITALS — BP 115/76 | HR 88 | Ht 61.0 in | Wt 233.0 lb

## 2021-01-03 DIAGNOSIS — E0590 Thyrotoxicosis, unspecified without thyrotoxic crisis or storm: Secondary | ICD-10-CM | POA: Diagnosis not present
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
## 2021-01-11 ENCOUNTER — Ambulatory Visit: Payer: No Typology Code available for payment source | Admitting: Dermatology

## 2021-01-18 ENCOUNTER — Ambulatory Visit: Payer: No Typology Code available for payment source | Admitting: Internal Medicine

## 2021-02-06 ENCOUNTER — Ambulatory Visit: Payer: No Typology Code available for payment source | Admitting: Internal Medicine

## 2021-04-03 ENCOUNTER — Encounter (HOSPITAL_COMMUNITY): Payer: Self-pay | Admitting: Psychiatry

## 2021-04-03 ENCOUNTER — Telehealth (INDEPENDENT_AMBULATORY_CARE_PROVIDER_SITE_OTHER): Payer: No Typology Code available for payment source | Admitting: Psychiatry

## 2021-04-03 DIAGNOSIS — F331 Major depressive disorder, recurrent, moderate: Secondary | ICD-10-CM | POA: Diagnosis not present

## 2021-04-03 DIAGNOSIS — F4310 Post-traumatic stress disorder, unspecified: Secondary | ICD-10-CM | POA: Diagnosis not present

## 2021-04-03 DIAGNOSIS — F5102 Adjustment insomnia: Secondary | ICD-10-CM | POA: Diagnosis not present

## 2021-04-03 MED ORDER — ESCITALOPRAM OXALATE 10 MG PO TABS: 15.0000 mg | ORAL_TABLET | Freq: Every day | ORAL | 1 refills | Status: DC
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
## 2021-05-04 ENCOUNTER — Other Ambulatory Visit (HOSPITAL_COMMUNITY): Payer: Self-pay

## 2021-05-08 ENCOUNTER — Telehealth: Payer: Self-pay

## 2021-05-08 NOTE — Telephone Encounter (Signed)
Pt called and LMOM for [place] to call her back.  I called back x 2 no voice mail set up. ?

## 2021-05-10 ENCOUNTER — Encounter: Payer: Self-pay | Admitting: Internal Medicine

## 2021-05-10 ENCOUNTER — Ambulatory Visit (INDEPENDENT_AMBULATORY_CARE_PROVIDER_SITE_OTHER): Payer: No Typology Code available for payment source | Admitting: Internal Medicine

## 2021-05-10 ENCOUNTER — Other Ambulatory Visit (HOSPITAL_COMMUNITY): Payer: Self-pay

## 2021-05-10 VITALS — BP 140/88 | HR 104 | Resp 18 | Ht 61.0 in | Wt 235.6 lb

## 2021-05-10 DIAGNOSIS — R7303 Prediabetes: Secondary | ICD-10-CM | POA: Diagnosis not present

## 2021-05-10 DIAGNOSIS — E0590 Thyrotoxicosis, unspecified without thyrotoxic crisis or storm: Secondary | ICD-10-CM

## 2021-05-10 DIAGNOSIS — Z1211 Encounter for screening for malignant neoplasm of colon: Secondary | ICD-10-CM

## 2021-05-10 DIAGNOSIS — F321 Major depressive disorder, single episode, moderate: Secondary | ICD-10-CM

## 2021-05-10 DIAGNOSIS — I10 Essential (primary) hypertension: Secondary | ICD-10-CM | POA: Diagnosis not present

## 2021-05-10 MED ORDER — SEMAGLUTIDE-WEIGHT MANAGEMENT 2.4 MG/0.75ML ~~LOC~~ SOAJ
2.4000 mg | SUBCUTANEOUS | 0 refills | Status: DC
  Filled 2021-05-10 – 2021-09-06 (×2): qty 3, 28d supply, fill #0

## 2021-05-10 MED ORDER — SEMAGLUTIDE-WEIGHT MANAGEMENT 0.5 MG/0.5ML ~~LOC~~ SOAJ
0.5000 mg | SUBCUTANEOUS | 0 refills | Status: AC
  Filled 2021-05-10 – 2021-06-08 (×2): qty 2, 28d supply, fill #0

## 2021-05-10 MED ORDER — SEMAGLUTIDE-WEIGHT MANAGEMENT 1 MG/0.5ML ~~LOC~~ SOAJ
1.0000 mg | SUBCUTANEOUS | 0 refills | Status: AC
  Filled 2021-05-10 – 2021-07-11 (×2): qty 2, 28d supply, fill #0

## 2021-05-10 MED ORDER — SEMAGLUTIDE-WEIGHT MANAGEMENT 0.25 MG/0.5ML ~~LOC~~ SOAJ
0.2500 mg | SUBCUTANEOUS | 0 refills | Status: DC
  Filled 2021-05-10 – 2021-05-18 (×2): qty 2, 28d supply, fill #0

## 2021-05-10 MED ORDER — SEMAGLUTIDE-WEIGHT MANAGEMENT 1.7 MG/0.75ML ~~LOC~~ SOAJ
1.7000 mg | SUBCUTANEOUS | 0 refills | Status: DC
  Filled 2021-05-10 – 2021-07-11 (×2): qty 3, 28d supply, fill #0

## 2021-05-10 NOTE — Assessment & Plan Note (Signed)
Lab Results  ?Component Value Date  ? HGBA1C 5.7 (H) 11/21/2020  ? ?Continue to follow low-carb diet for now ?Started JOACZY for morbid obesity ?

## 2021-05-10 NOTE — Assessment & Plan Note (Signed)
Well controlled with Lexapro 10 mg daily ?

## 2021-05-10 NOTE — Assessment & Plan Note (Signed)
Lab Results  ?Component Value Date  ? TSH 0.317 (L) 12/05/2020  ? ?Followed by Endocrinology ?
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
## 2021-05-10 NOTE — Assessment & Plan Note (Signed)
BMI Readings from Last 3 Encounters:  ?05/10/21 44.52 kg/m?  ?01/03/21 44.02 kg/m?  ?12/05/20 44.59 kg/m?  ? ?Has been trying to follow low-carb diet and performs exercise/walking as tolerated ?Was on metformin for prediabetes in the past ?Started Wegovy - initial BMI 44.52 ?

## 2021-05-10 NOTE — Patient Instructions (Signed)
Please start taking Wegovy as prescribed. ? ?Please continue to take other medications as prescribed. ? ?Please continue to follow low carb and low salt diet and perform moderate exercise/walking at least 150 mins/week. ?

## 2021-05-10 NOTE — Assessment & Plan Note (Signed)
BP Readings from Last 1 Encounters:  ?05/10/21 140/88  ? ?Slightly elevated as she had to rush to make it to her appointment today ?Usually well-controlled with amlodipine 10 mg daily, losartan 50 mg daily and HCTZ 25 mg daily now ?Counseled for compliance with the medications ?Advised DASH diet and moderate exercise/walking, at least 150 mins/week ?

## 2021-05-18 ENCOUNTER — Other Ambulatory Visit (HOSPITAL_COMMUNITY): Payer: Self-pay

## 2021-05-25 ENCOUNTER — Other Ambulatory Visit (HOSPITAL_COMMUNITY): Payer: Self-pay

## 2021-05-31 ENCOUNTER — Telehealth: Payer: Self-pay | Admitting: Internal Medicine

## 2021-05-31 NOTE — Telephone Encounter (Signed)
Pt called stating she is going to take the 3rd injection of Semaglutide-Weight Management 0.25 MG/0.5ML SOAJ This weekend. States she is doing great on it. Wants to know if she can please go ahead and get a refill on this? ? ? ? ?[place] ? ? ? ?

## 2021-06-01 NOTE — Telephone Encounter (Signed)
Pt advised with verbal understanding  °

## 2021-06-02 LAB — COLOGUARD: COLOGUARD: NEGATIVE

## 2021-06-05 ENCOUNTER — Other Ambulatory Visit (HOSPITAL_COMMUNITY): Payer: Self-pay

## 2021-06-08 ENCOUNTER — Other Ambulatory Visit (HOSPITAL_COMMUNITY): Payer: Self-pay

## 2021-06-26 ENCOUNTER — Telehealth (INDEPENDENT_AMBULATORY_CARE_PROVIDER_SITE_OTHER): Payer: No Typology Code available for payment source | Admitting: Psychiatry

## 2021-06-26 ENCOUNTER — Encounter (HOSPITAL_COMMUNITY): Payer: Self-pay | Admitting: Psychiatry

## 2021-06-26 DIAGNOSIS — F331 Major depressive disorder, recurrent, moderate: Secondary | ICD-10-CM

## 2021-06-26 DIAGNOSIS — F5102 Adjustment insomnia: Secondary | ICD-10-CM

## 2021-06-26 DIAGNOSIS — F4310 Post-traumatic stress disorder, unspecified: Secondary | ICD-10-CM | POA: Diagnosis not present

## 2021-06-26 MED ORDER — ESCITALOPRAM OXALATE 10 MG PO TABS: 15.0000 mg | ORAL_TABLET | Freq: Every day | ORAL | 2 refills | Status: DC
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
## 2021-06-27 LAB — TSH: TSH: 0.419 u[IU]/mL — ABNORMAL LOW (ref 0.450–4.500)

## 2021-06-27 LAB — T3, FREE: T3, Free: 3.5 pg/mL (ref 2.0–4.4)

## 2021-06-27 LAB — T4, FREE: Free T4: 1.2 ng/dL (ref 0.82–1.77)

## 2021-07-04 ENCOUNTER — Ambulatory Visit: Payer: No Typology Code available for payment source | Admitting: Nurse Practitioner

## 2021-07-05 ENCOUNTER — Other Ambulatory Visit: Payer: Self-pay | Admitting: Internal Medicine

## 2021-07-05 ENCOUNTER — Telehealth: Payer: Self-pay

## 2021-07-05 ENCOUNTER — Other Ambulatory Visit (HOSPITAL_COMMUNITY): Payer: Self-pay

## 2021-07-05 ENCOUNTER — Encounter: Payer: Self-pay | Admitting: Nurse Practitioner

## 2021-07-05 NOTE — Telephone Encounter (Signed)
Sent mychart message

## 2021-07-05 NOTE — Telephone Encounter (Signed)
Titrated dose will be available 07-06-21 please let her know she just needs to call and have them fill on or after 07-06-21

## 2021-07-05 NOTE — Telephone Encounter (Signed)
Patient called need refill Semaglutide-Weight Management 0.5 MG/0.5ML SOAJ  Made a mistake last shot will be Saturday, 06.03.2023 mistakely put Jun 07, 2021 on mychart.  Pharmacy  [facility]  [address], [place] [address]  Phone:  [phone]  Fax:  [phone]

## 2021-07-11 ENCOUNTER — Other Ambulatory Visit (HOSPITAL_COMMUNITY): Payer: Self-pay

## 2021-07-11 ENCOUNTER — Telehealth: Payer: Self-pay

## 2021-07-11 NOTE — Telephone Encounter (Signed)
Patient called said her pharmacy does not have the Wegovy 1 mg, but manufacture will not have it until the fall, saying can go to 1.7 mg but need doctor approval.  [facility]  [address] [place] [address]  Phone:  [phone]  Fax:  [phone]  DEA #:  [id number]

## 2021-07-11 NOTE — Telephone Encounter (Signed)
Patient aware next dose is sent to pharmacy

## 2021-07-12 ENCOUNTER — Other Ambulatory Visit: Payer: Self-pay | Admitting: *Deleted

## 2021-07-12 ENCOUNTER — Other Ambulatory Visit (HOSPITAL_COMMUNITY): Payer: Self-pay

## 2021-07-12 NOTE — Telephone Encounter (Signed)
Pt has filled the 1.7 dose already and sent to pharmacy

## 2021-08-05 ENCOUNTER — Other Ambulatory Visit (INDEPENDENT_AMBULATORY_CARE_PROVIDER_SITE_OTHER): Payer: Self-pay

## 2021-08-05 ENCOUNTER — Other Ambulatory Visit: Payer: Self-pay | Admitting: Internal Medicine

## 2021-08-05 DIAGNOSIS — I10 Essential (primary) hypertension: Secondary | ICD-10-CM

## 2021-08-07 ENCOUNTER — Other Ambulatory Visit (HOSPITAL_COMMUNITY): Payer: Self-pay

## 2021-08-07 MED ORDER — WEGOVY 1.7 MG/0.75ML ~~LOC~~ SOAJ
1.7000 mg | SUBCUTANEOUS | 0 refills | Status: DC
  Filled 2021-08-07: qty 3, 28d supply, fill #0

## 2021-08-07 MED ORDER — VITAMIN D3 125 MCG (5000 UT) PO TABS
2.0000 | ORAL_TABLET | Freq: Every day | ORAL | 2 refills | Status: AC
  Filled 2021-08-07 – 2021-09-29 (×3): qty 30, 15d supply, fill #0

## 2021-08-07 MED ORDER — LOSARTAN POTASSIUM 50 MG PO TABS
50.0000 mg | ORAL_TABLET | Freq: Every day | ORAL | 1 refills | Status: DC
  Filled 2021-08-07: qty 90, 90d supply, fill #0
  Filled 2022-02-07: qty 90, 90d supply, fill #1

## 2021-08-07 MED ORDER — HYDROCHLOROTHIAZIDE 25 MG PO TABS
25.0000 mg | ORAL_TABLET | Freq: Every day | ORAL | 1 refills | Status: DC
  Filled 2021-08-07: qty 90, 90d supply, fill #0
  Filled 2022-02-07: qty 90, 90d supply, fill #1

## 2021-08-07 MED ORDER — AMLODIPINE BESYLATE 10 MG PO TABS
10.0000 mg | ORAL_TABLET | Freq: Every day | ORAL | 1 refills | Status: DC
  Filled 2021-08-07 (×2): qty 90, 90d supply, fill #0
  Filled 2022-02-07: qty 90, 90d supply, fill #1

## 2021-08-09 ENCOUNTER — Ambulatory Visit (INDEPENDENT_AMBULATORY_CARE_PROVIDER_SITE_OTHER): Payer: No Typology Code available for payment source | Admitting: Internal Medicine

## 2021-08-09 ENCOUNTER — Encounter: Payer: Self-pay | Admitting: Internal Medicine

## 2021-08-09 ENCOUNTER — Other Ambulatory Visit (HOSPITAL_COMMUNITY): Payer: Self-pay

## 2021-08-09 VITALS — BP 138/86 | HR 82 | Resp 18 | Ht 61.0 in | Wt 235.8 lb

## 2021-08-09 DIAGNOSIS — K219 Gastro-esophageal reflux disease without esophagitis: Secondary | ICD-10-CM

## 2021-08-09 DIAGNOSIS — I10 Essential (primary) hypertension: Secondary | ICD-10-CM | POA: Diagnosis not present

## 2021-08-09 DIAGNOSIS — Z0001 Encounter for general adult medical examination with abnormal findings: Secondary | ICD-10-CM | POA: Diagnosis not present

## 2021-08-09 DIAGNOSIS — Z23 Encounter for immunization: Secondary | ICD-10-CM | POA: Diagnosis not present

## 2021-08-09 DIAGNOSIS — E559 Vitamin D deficiency, unspecified: Secondary | ICD-10-CM

## 2021-08-09 DIAGNOSIS — R7303 Prediabetes: Secondary | ICD-10-CM

## 2021-08-09 DIAGNOSIS — E0590 Thyrotoxicosis, unspecified without thyrotoxic crisis or storm: Secondary | ICD-10-CM

## 2021-08-09 MED ORDER — FAMOTIDINE 20 MG PO TABS
20.0000 mg | ORAL_TABLET | Freq: Every day | ORAL | 3 refills | Status: AC
  Filled 2021-08-09: qty 90, 90d supply, fill #0
  Filled 2022-02-07: qty 90, 90d supply, fill #1

## 2021-08-09 NOTE — Assessment & Plan Note (Signed)
Lab Results  Component Value Date   HGBA1C 5.7 (H) 11/21/2020   Continue to follow low-carb diet for now On Wegovy for morbid obesity 
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
## 2021-08-09 NOTE — Assessment & Plan Note (Signed)
Lab Results  Component Value Date   TSH 0.419 (L) 06/26/2021   Followed by Endocrinology

## 2021-08-09 NOTE — Patient Instructions (Addendum)
Please continue to take medications as prescribed.  Please continue to follow low carb diet and perform moderate exercise/walking at least 150 mins/week.  Please get fasting blood tests done before the next visit.

## 2021-08-09 NOTE — Assessment & Plan Note (Signed)
On Pepcid 20 mg daily

## 2021-08-09 NOTE — Assessment & Plan Note (Signed)
Physical exam as documented. Fasting blood tests ordered. 

## 2021-08-09 NOTE — Assessment & Plan Note (Signed)
BMI Readings from Last 3 Encounters:  08/09/21 44.55 kg/m  05/10/21 44.52 kg/m  01/03/21 44.02 kg/m   Has been trying to follow low-carb diet and performs exercise/walking as tolerated  - needs to cut down red meat and bread intake Was on metformin for prediabetes in the past On Wegovy - initial BMI 44.52, uptitrate dose as tolerated

## 2021-08-09 NOTE — Assessment & Plan Note (Signed)
BP Readings from Last 1 Encounters:  08/09/21 138/86   Usually well-controlled with amlodipine 10 mg daily, losartan 50 mg daily and HCTZ 25 mg daily now Counseled for compliance with the medications Advised DASH diet and moderate exercise/walking, at least 150 mins/week

## 2021-08-12 ENCOUNTER — Telehealth: Payer: No Typology Code available for payment source | Admitting: Family

## 2021-08-12 ENCOUNTER — Encounter: Payer: Self-pay | Admitting: Internal Medicine

## 2021-08-12 DIAGNOSIS — T7840XA Allergy, unspecified, initial encounter: Secondary | ICD-10-CM | POA: Diagnosis not present

## 2021-08-12 MED ORDER — TRIAMCINOLONE ACETONIDE 0.1 % EX CREA: 1.0000 | TOPICAL_CREAM | Freq: Two times a day (BID) | CUTANEOUS | 0 refills | Status: DC

## 2021-08-12 NOTE — Progress Notes (Signed)
E Visit for Rash  We are sorry that you are not feeling well. Here is how we plan to help!    Based on what you shared with me you may have a virus or an allergic reaction.  Avoid contact with pregnant women until a diagnosis is made.  Most viral rashes are contagious (especially if a fever is present).  You can return to work or school after the rash is gone or when your doctor says it is safe to return with the rash.    I recommend you take Benadryl 25 mg - 50 mg every 4 hours to control the symptoms but if they last over 24 hours it is best that you see an office based provider for follow up. I have also sent you kenalog cream that you will use twice a day on the area.       HOME CARE:  Take cool showers and avoid direct sunlight. Apply cool compress or wet dressings. Take a bath in an oatmeal bath.  Sprinkle content of one Aveeno packet under running faucet with comfortably warm water.  Bathe for 15-20 minutes, 1-2 times daily.  Pat dry with a towel. Do not rub the rash. Use hydrocortisone cream. Take an antihistamine like Benadryl for widespread rashes that itch.  The adult dose of Benadryl is 25-50 mg by mouth 4 times daily. Caution:  This type of medication may cause sleepiness.  Do not drink alcohol, drive, or operate dangerous machinery while taking antihistamines.  Do not take these medications if you have prostate enlargement.  Read package instructions thoroughly on all medications that you take.  GET HELP RIGHT AWAY IF:  Symptoms don't go away after treatment. Severe itching that persists. If you rash spreads or swells. If you rash begins to smell. If it blisters and opens or develops a yellow-brown crust. You develop a fever. You have a sore throat. You become short of breath.  MAKE SURE YOU:  Understand these instructions. Will watch your condition. Will get help right away if you are not doing well or get worse.  Thank you for choosing an e-visit.  Your  e-visit answers were reviewed by a board certified advanced clinical practitioner to complete your personal care plan. Depending upon the condition, your plan could have included both over the counter or prescription medications.  Please review your pharmacy choice. Make sure the pharmacy is open so you can pick up prescription now. If there is a problem, you may contact your provider through [employer] and have the prescription routed to another pharmacy.  Your safety is important to [place]. If you have drug allergies check your prescription carefully.   For the next 24 hours you can use MyChart to ask questions about today's visit, request a non-urgent call back, or ask for a work or school excuse. You will get an email in the next two days asking about your experience. I hope that your e-visit has been valuable and will speed your recovery.  Approximately 5 minutes was spent documenting and reviewing patient's chart.

## 2021-09-06 ENCOUNTER — Other Ambulatory Visit (INDEPENDENT_AMBULATORY_CARE_PROVIDER_SITE_OTHER): Payer: Self-pay

## 2021-09-07 ENCOUNTER — Other Ambulatory Visit (HOSPITAL_COMMUNITY): Payer: Self-pay

## 2021-09-08 ENCOUNTER — Other Ambulatory Visit (HOSPITAL_COMMUNITY): Payer: Self-pay

## 2021-09-29 ENCOUNTER — Other Ambulatory Visit: Payer: Self-pay | Admitting: Internal Medicine

## 2021-09-29 ENCOUNTER — Other Ambulatory Visit (HOSPITAL_COMMUNITY): Payer: Self-pay

## 2021-09-29 MED ORDER — WEGOVY 2.4 MG/0.75ML ~~LOC~~ SOAJ
2.4000 mg | SUBCUTANEOUS | 3 refills | Status: DC
  Filled 2021-09-29: qty 3, 28d supply, fill #0
  Filled 2021-10-07 – 2021-10-12 (×2): qty 3, 28d supply, fill #1

## 2021-10-04 ENCOUNTER — Telehealth (HOSPITAL_COMMUNITY): Payer: No Typology Code available for payment source | Admitting: Psychiatry

## 2021-10-10 ENCOUNTER — Other Ambulatory Visit (HOSPITAL_COMMUNITY): Payer: Self-pay

## 2021-10-12 ENCOUNTER — Encounter: Payer: Self-pay | Admitting: Internal Medicine

## 2021-10-12 ENCOUNTER — Other Ambulatory Visit (HOSPITAL_COMMUNITY): Payer: Self-pay

## 2021-10-12 ENCOUNTER — Ambulatory Visit (INDEPENDENT_AMBULATORY_CARE_PROVIDER_SITE_OTHER): Payer: No Typology Code available for payment source | Admitting: Internal Medicine

## 2021-10-12 DIAGNOSIS — K529 Noninfective gastroenteritis and colitis, unspecified: Secondary | ICD-10-CM

## 2021-10-12 MED ORDER — AZITHROMYCIN 500 MG PO TABS: 500.0000 mg | ORAL_TABLET | Freq: Every day | ORAL | 0 refills | Status: DC
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
## 2021-11-07 ENCOUNTER — Telehealth (INDEPENDENT_AMBULATORY_CARE_PROVIDER_SITE_OTHER): Payer: No Typology Code available for payment source | Admitting: Psychiatry

## 2021-11-07 ENCOUNTER — Encounter (HOSPITAL_COMMUNITY): Payer: Self-pay | Admitting: Psychiatry

## 2021-11-07 DIAGNOSIS — F4310 Post-traumatic stress disorder, unspecified: Secondary | ICD-10-CM | POA: Diagnosis not present

## 2021-11-07 DIAGNOSIS — F5102 Adjustment insomnia: Secondary | ICD-10-CM | POA: Diagnosis not present

## 2021-11-07 DIAGNOSIS — F331 Major depressive disorder, recurrent, moderate: Secondary | ICD-10-CM

## 2021-11-07 MED ORDER — ESCITALOPRAM OXALATE 10 MG PO TABS: 15.0000 mg | ORAL_TABLET | Freq: Every day | ORAL | 2 refills | Status: DC
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
## 2021-12-12 ENCOUNTER — Ambulatory Visit (INDEPENDENT_AMBULATORY_CARE_PROVIDER_SITE_OTHER): Payer: No Typology Code available for payment source | Admitting: Internal Medicine

## 2021-12-12 ENCOUNTER — Other Ambulatory Visit (HOSPITAL_COMMUNITY): Payer: Self-pay

## 2021-12-12 ENCOUNTER — Encounter: Payer: Self-pay | Admitting: Internal Medicine

## 2021-12-12 VITALS — BP 135/86 | HR 87 | Ht 61.0 in | Wt 243.8 lb

## 2021-12-12 DIAGNOSIS — K219 Gastro-esophageal reflux disease without esophagitis: Secondary | ICD-10-CM

## 2021-12-12 DIAGNOSIS — I10 Essential (primary) hypertension: Secondary | ICD-10-CM

## 2021-12-12 DIAGNOSIS — F331 Major depressive disorder, recurrent, moderate: Secondary | ICD-10-CM | POA: Diagnosis not present

## 2021-12-12 DIAGNOSIS — R7303 Prediabetes: Secondary | ICD-10-CM

## 2021-12-12 DIAGNOSIS — Z23 Encounter for immunization: Secondary | ICD-10-CM

## 2021-12-12 DIAGNOSIS — R110 Nausea: Secondary | ICD-10-CM

## 2021-12-12 DIAGNOSIS — E782 Mixed hyperlipidemia: Secondary | ICD-10-CM

## 2021-12-12 MED ORDER — WEGOVY 2.4 MG/0.75ML ~~LOC~~ SOAJ
2.4000 mg | SUBCUTANEOUS | 3 refills | Status: DC
  Filled 2021-12-12 – 2022-01-15 (×2): qty 3, 28d supply, fill #0
  Filled 2022-02-07: qty 3, 28d supply, fill #1
  Filled 2022-03-12 – 2022-03-16 (×3): qty 3, 28d supply, fill #2

## 2021-12-12 MED ORDER — WEGOVY 1.7 MG/0.75ML ~~LOC~~ SOAJ
1.7000 mg | SUBCUTANEOUS | 0 refills | Status: DC
  Filled 2021-12-12: qty 3, 28d supply, fill #0

## 2021-12-12 MED ORDER — ONDANSETRON HCL 4 MG PO TABS
4.0000 mg | ORAL_TABLET | Freq: Three times a day (TID) | ORAL | 0 refills | Status: AC | PRN
  Filled 2021-12-12: qty 20, 7d supply, fill #0

## 2021-12-12 NOTE — Assessment & Plan Note (Addendum)
Well controlled with Lexapro 15 mg daily Followed by psychiatry

## 2021-12-12 NOTE — Assessment & Plan Note (Signed)
Well controlled with Pepcid 20 mg daily

## 2021-12-12 NOTE — Patient Instructions (Signed)
Please start Wegovy 1.7 mg dose and increase it in the next month to 2.4 mg.  Please continue to follow low carb diet and perform moderate exercise/walking at least 150 mins/week.  Please get fasting blood tests done before the next visit.

## 2021-12-12 NOTE — Assessment & Plan Note (Signed)
BP Readings from Last 1 Encounters:  12/12/21 135/86   Usually well-controlled with amlodipine 10 mg daily, losartan 50 mg daily and HCTZ 25 mg daily now Counseled for compliance with the medications Advised DASH diet and moderate exercise/walking, at least 150 mins/week
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
## 2021-12-12 NOTE — Assessment & Plan Note (Signed)
Lab Results  Component Value Date   HGBA1C 5.7 (H) 11/21/2020   Continue to follow low-carb diet for now On Wegovy for morbid obesity 

## 2021-12-12 NOTE — Assessment & Plan Note (Signed)
BMI Readings from Last 3 Encounters:  12/12/21 46.07 kg/m  08/09/21 44.55 kg/m  05/10/21 44.52 kg/m   Has been trying to follow low-carb diet and performs exercise/walking as tolerated  - needs to cut down red meat and bread intake Was on metformin for prediabetes in the past On Wegovy - initial BMI 44.52, had been losing weight, but stopped due to episode of diarrhea and vomiting, which was likely due to acute gastro enteritis and has resolved now  Restart Wegovy 1.7 mg qw and uptitrate dose as tolerated

## 2021-12-14 ENCOUNTER — Other Ambulatory Visit (HOSPITAL_COMMUNITY): Payer: Self-pay

## 2021-12-18 ENCOUNTER — Other Ambulatory Visit (HOSPITAL_COMMUNITY): Payer: Self-pay

## 2021-12-21 ENCOUNTER — Other Ambulatory Visit (HOSPITAL_COMMUNITY): Payer: Self-pay

## 2021-12-26 ENCOUNTER — Other Ambulatory Visit (HOSPITAL_COMMUNITY): Payer: Self-pay

## 2021-12-26 ENCOUNTER — Other Ambulatory Visit: Payer: Self-pay

## 2021-12-26 ENCOUNTER — Encounter: Payer: Self-pay | Admitting: Internal Medicine

## 2021-12-26 DIAGNOSIS — G4709 Other insomnia: Secondary | ICD-10-CM

## 2021-12-26 MED ORDER — TRAZODONE HCL 50 MG PO TABS
25.0000 mg | ORAL_TABLET | Freq: Every evening | ORAL | 0 refills | Status: DC | PRN
  Filled 2021-12-26 – 2022-02-07 (×2): qty 30, 30d supply, fill #0

## 2021-12-26 MED ORDER — TRAZODONE HCL 50 MG PO TABS: 25.0000 mg | ORAL_TABLET | Freq: Every evening | ORAL | 0 refills | Status: DC | PRN

## 2022-01-15 ENCOUNTER — Other Ambulatory Visit (HOSPITAL_COMMUNITY): Payer: Self-pay

## 2022-02-06 ENCOUNTER — Encounter: Payer: Self-pay | Admitting: Internal Medicine

## 2022-02-07 ENCOUNTER — Other Ambulatory Visit (HOSPITAL_COMMUNITY): Payer: Self-pay

## 2022-02-07 ENCOUNTER — Other Ambulatory Visit: Payer: Self-pay

## 2022-02-13 ENCOUNTER — Encounter (HOSPITAL_COMMUNITY): Payer: Self-pay | Admitting: Psychiatry

## 2022-02-13 ENCOUNTER — Telehealth (INDEPENDENT_AMBULATORY_CARE_PROVIDER_SITE_OTHER): Payer: 59 | Admitting: Psychiatry

## 2022-02-13 DIAGNOSIS — F4310 Post-traumatic stress disorder, unspecified: Secondary | ICD-10-CM | POA: Diagnosis not present

## 2022-02-13 DIAGNOSIS — F5102 Adjustment insomnia: Secondary | ICD-10-CM | POA: Diagnosis not present

## 2022-02-13 DIAGNOSIS — F331 Major depressive disorder, recurrent, moderate: Secondary | ICD-10-CM

## 2022-02-13 MED ORDER — ESCITALOPRAM OXALATE 10 MG PO TABS: 15.0000 mg | ORAL_TABLET | Freq: Every day | ORAL | 2 refills | Status: DC
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
## 2022-03-12 ENCOUNTER — Other Ambulatory Visit (HOSPITAL_COMMUNITY): Payer: Self-pay

## 2022-03-16 ENCOUNTER — Encounter: Payer: Self-pay | Admitting: Internal Medicine

## 2022-03-16 ENCOUNTER — Other Ambulatory Visit (HOSPITAL_COMMUNITY): Payer: Self-pay

## 2022-03-16 ENCOUNTER — Other Ambulatory Visit: Payer: Self-pay

## 2022-03-16 MED ORDER — WEGOVY 2.4 MG/0.75ML ~~LOC~~ SOAJ
2.4000 mg | SUBCUTANEOUS | 3 refills | Status: DC
  Filled 2022-03-19: qty 3, 28d supply, fill #0
  Filled 2022-04-14: qty 3, 28d supply, fill #1
  Filled 2022-04-16 (×2): qty 3, 28d supply, fill #0
  Filled 2022-05-16: qty 3, 28d supply, fill #1

## 2022-03-16 NOTE — Telephone Encounter (Signed)
Spoke with patient.

## 2022-03-19 ENCOUNTER — Other Ambulatory Visit (HOSPITAL_COMMUNITY): Payer: Self-pay

## 2022-03-22 ENCOUNTER — Other Ambulatory Visit (HOSPITAL_COMMUNITY): Payer: Self-pay

## 2022-04-05 ENCOUNTER — Encounter: Payer: Self-pay | Admitting: Radiology

## 2022-04-06 ENCOUNTER — Other Ambulatory Visit (HOSPITAL_COMMUNITY): Payer: Self-pay

## 2022-04-12 ENCOUNTER — Ambulatory Visit (INDEPENDENT_AMBULATORY_CARE_PROVIDER_SITE_OTHER): Payer: 59 | Admitting: Internal Medicine

## 2022-04-12 ENCOUNTER — Encounter: Payer: Self-pay | Admitting: Internal Medicine

## 2022-04-12 ENCOUNTER — Other Ambulatory Visit (HOSPITAL_COMMUNITY): Payer: Self-pay

## 2022-04-12 VITALS — BP 140/92 | HR 91 | Ht 61.0 in | Wt 219.6 lb

## 2022-04-12 DIAGNOSIS — F331 Major depressive disorder, recurrent, moderate: Secondary | ICD-10-CM | POA: Diagnosis not present

## 2022-04-12 DIAGNOSIS — E0590 Thyrotoxicosis, unspecified without thyrotoxic crisis or storm: Secondary | ICD-10-CM | POA: Diagnosis not present

## 2022-04-12 DIAGNOSIS — R7303 Prediabetes: Secondary | ICD-10-CM

## 2022-04-12 DIAGNOSIS — I10 Essential (primary) hypertension: Secondary | ICD-10-CM | POA: Diagnosis not present

## 2022-04-12 DIAGNOSIS — M170 Bilateral primary osteoarthritis of knee: Secondary | ICD-10-CM

## 2022-04-12 DIAGNOSIS — E782 Mixed hyperlipidemia: Secondary | ICD-10-CM | POA: Diagnosis not present

## 2022-04-12 DIAGNOSIS — R079 Chest pain, unspecified: Secondary | ICD-10-CM | POA: Insufficient documentation

## 2022-04-12 MED ORDER — LOSARTAN POTASSIUM-HCTZ 100-25 MG PO TABS
1.0000 | ORAL_TABLET | Freq: Every day | ORAL | 3 refills | Status: DC
  Filled 2022-04-12 – 2022-05-16 (×4): qty 90, 90d supply, fill #0
  Filled 2022-11-07 (×2): qty 90, 90d supply, fill #1
  Filled 2023-02-20: qty 90, 90d supply, fill #2

## 2022-04-12 MED ORDER — MELOXICAM 7.5 MG PO TABS: 7.5000 mg | ORAL_TABLET | Freq: Every day | ORAL | 3 refills | Status: DC | PRN

## 2022-04-12 MED ORDER — AMLODIPINE BESYLATE 10 MG PO TABS
10.0000 mg | ORAL_TABLET | Freq: Every day | ORAL | 3 refills | Status: DC
  Filled 2022-04-12 – 2022-05-16 (×2): qty 90, 90d supply, fill #0
  Filled 2022-11-07 (×2): qty 90, 90d supply, fill #1
  Filled 2023-02-20: qty 90, 90d supply, fill #2

## 2022-04-12 NOTE — Assessment & Plan Note (Signed)
Intermittent episodes of left-sided chest pain EKG: Sinus rhythm.  No signs of active ischemia.  Likely due to uncontrolled GAD If persistent episodes of chest pain, will get cardiology evaluation
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
## 2022-04-12 NOTE — Assessment & Plan Note (Signed)
Lab Results  Component Value Date   HGBA1C 5.7 (H) 11/21/2020   Continue to follow low-carb diet for now On Wegovy for morbid obesity

## 2022-04-12 NOTE — Assessment & Plan Note (Signed)
Tylenol arthritis as needed for pain Added Mobic as needed for pain or swelling Weight loss can be beneficial for knee pain as well Has seen orthopedic surgeon

## 2022-04-12 NOTE — Patient Instructions (Signed)
Please start taking Losartan-HCTZ instead of separate tablets as prescribed.  Please continue to take medications as prescribed.  Please continue to follow low carb diet and perform moderate exercise/walking at least 150 mins/week.

## 2022-04-12 NOTE — Assessment & Plan Note (Signed)
Lab Results  Component Value Date   TSH 0.419 (L) 06/26/2021   Followed by Endocrinology - lost follow up Check TSH and free T4

## 2022-04-12 NOTE — Assessment & Plan Note (Signed)
Was well controlled with Lexapro 15 mg daily Followed by psychiatry

## 2022-04-12 NOTE — Assessment & Plan Note (Signed)
BP Readings from Last 1 Encounters:  04/12/22 (!) 140/92   Uncontrolled with amlodipine 10 mg daily, losartan 50 mg daily and HCTZ 25 mg daily now Increased dose to losartan-HCTZ 100-25 mg QD Counseled for compliance with the medications Advised DASH diet and moderate exercise/walking, at least 150 mins/week

## 2022-04-14 ENCOUNTER — Other Ambulatory Visit (HOSPITAL_COMMUNITY): Payer: Self-pay

## 2022-04-16 ENCOUNTER — Other Ambulatory Visit: Payer: Self-pay

## 2022-04-16 ENCOUNTER — Other Ambulatory Visit (HOSPITAL_COMMUNITY): Payer: Self-pay

## 2022-04-17 ENCOUNTER — Other Ambulatory Visit: Payer: Self-pay

## 2022-04-18 ENCOUNTER — Encounter (HOSPITAL_COMMUNITY): Payer: Self-pay

## 2022-04-18 ENCOUNTER — Other Ambulatory Visit (HOSPITAL_COMMUNITY): Payer: Self-pay

## 2022-04-23 ENCOUNTER — Other Ambulatory Visit: Payer: Self-pay

## 2022-05-16 ENCOUNTER — Other Ambulatory Visit: Payer: Self-pay

## 2022-05-16 ENCOUNTER — Other Ambulatory Visit (HOSPITAL_COMMUNITY): Payer: Self-pay

## 2022-05-21 ENCOUNTER — Telehealth (HOSPITAL_COMMUNITY): Payer: 59 | Admitting: Psychiatry

## 2022-07-17 ENCOUNTER — Ambulatory Visit (INDEPENDENT_AMBULATORY_CARE_PROVIDER_SITE_OTHER): Payer: 59 | Admitting: Internal Medicine

## 2022-07-17 ENCOUNTER — Encounter: Payer: Self-pay | Admitting: Internal Medicine

## 2022-07-17 ENCOUNTER — Other Ambulatory Visit (HOSPITAL_COMMUNITY): Payer: Self-pay

## 2022-07-17 VITALS — BP 138/86 | HR 87 | Ht 61.0 in | Wt 209.8 lb

## 2022-07-17 DIAGNOSIS — R7303 Prediabetes: Secondary | ICD-10-CM

## 2022-07-17 DIAGNOSIS — J3089 Other allergic rhinitis: Secondary | ICD-10-CM

## 2022-07-17 DIAGNOSIS — F331 Major depressive disorder, recurrent, moderate: Secondary | ICD-10-CM | POA: Diagnosis not present

## 2022-07-17 DIAGNOSIS — I10 Essential (primary) hypertension: Secondary | ICD-10-CM | POA: Diagnosis not present

## 2022-07-17 DIAGNOSIS — E782 Mixed hyperlipidemia: Secondary | ICD-10-CM

## 2022-07-17 DIAGNOSIS — E0590 Thyrotoxicosis, unspecified without thyrotoxic crisis or storm: Secondary | ICD-10-CM

## 2022-07-17 MED ORDER — QSYMIA 3.75-23 MG PO CP24
1.0000 | ORAL_CAPSULE | Freq: Every day | ORAL | 0 refills | Status: DC
  Filled 2022-07-17 – 2022-08-06 (×3): qty 30, 30d supply, fill #0
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
## 2022-07-17 NOTE — Patient Instructions (Addendum)
Please start taking Qsymia as prescribed.  Please continue to take other medications as prescribed.  Please continue to follow low carb diet and perform moderate exercise/walking at least 150 mins/week.  Please get fasting blood tests done within a week.

## 2022-07-18 ENCOUNTER — Other Ambulatory Visit (HOSPITAL_COMMUNITY): Payer: Self-pay

## 2022-07-19 ENCOUNTER — Other Ambulatory Visit (HOSPITAL_COMMUNITY): Payer: Self-pay

## 2022-07-20 ENCOUNTER — Other Ambulatory Visit (HOSPITAL_COMMUNITY): Payer: Self-pay | Admitting: Internal Medicine

## 2022-07-20 ENCOUNTER — Other Ambulatory Visit (HOSPITAL_COMMUNITY): Payer: Self-pay

## 2022-07-20 NOTE — Assessment & Plan Note (Signed)
BP Readings from Last 1 Encounters:  07/17/22 138/86   Well-controlled with amlodipine 10 mg daily and losartan-HCTZ 100-25 mg QD Counseled for compliance with the medications Advised DASH diet and moderate exercise/walking, at least 150 mins/week

## 2022-07-20 NOTE — Assessment & Plan Note (Signed)
BMI Readings from Last 3 Encounters:  07/17/22 39.64 kg/m  04/12/22 41.49 kg/m  12/12/21 46.07 kg/m   Has been trying to follow low-carb diet and performs exercise/walking as tolerated  - has cut down red meat and bread intake Was on metformin for prediabetes in the past Was on Wegovy - initial BMI 44.52, had been losing weight, but had stop due to loss of coverage She is motivated to follow low-carb diet and perform moderate exercise/walking Started Qsymia - plan to increase dose as tolerated

## 2022-07-20 NOTE — Assessment & Plan Note (Addendum)
Well controlled with Lexapro 15 mg daily Followed by psychiatry 

## 2022-07-20 NOTE — Assessment & Plan Note (Signed)
Lab Results  Component Value Date   TSH 0.419 (L) 06/26/2021   Followed by Endocrinology - lost follow up Check TSH and free T4 

## 2022-07-20 NOTE — Assessment & Plan Note (Addendum)
Lab Results  Component Value Date   HGBA1C 5.7 (H) 11/21/2020   Continue to follow low-carb diet for now

## 2022-07-20 NOTE — Assessment & Plan Note (Signed)
Current symptoms are likely due to allergic rhinitis Usually well controlled with Zyrtec Advised to try nasal saline spray or Flonase Can use humidifier or vaporizer for nasal congestion

## 2022-07-23 ENCOUNTER — Other Ambulatory Visit (HOSPITAL_COMMUNITY): Payer: Self-pay

## 2022-07-23 ENCOUNTER — Encounter: Payer: Self-pay | Admitting: Internal Medicine

## 2022-07-23 MED ORDER — ESCITALOPRAM OXALATE 10 MG PO TABS
15.0000 mg | ORAL_TABLET | Freq: Every day | ORAL | 2 refills | Status: DC
  Filled 2022-07-23 – 2022-08-16 (×2): qty 45, 30d supply, fill #0
  Filled 2022-10-09: qty 45, 30d supply, fill #1
  Filled 2022-11-07 (×2): qty 45, 30d supply, fill #2

## 2022-07-24 ENCOUNTER — Other Ambulatory Visit: Payer: Self-pay | Admitting: Internal Medicine

## 2022-07-24 DIAGNOSIS — J019 Acute sinusitis, unspecified: Secondary | ICD-10-CM

## 2022-07-24 MED ORDER — AZITHROMYCIN 250 MG PO TABS: ORAL_TABLET | ORAL | 0 refills | Status: AC

## 2022-07-26 DIAGNOSIS — R7303 Prediabetes: Secondary | ICD-10-CM | POA: Diagnosis not present

## 2022-07-26 DIAGNOSIS — E0590 Thyrotoxicosis, unspecified without thyrotoxic crisis or storm: Secondary | ICD-10-CM | POA: Diagnosis not present

## 2022-07-26 DIAGNOSIS — F331 Major depressive disorder, recurrent, moderate: Secondary | ICD-10-CM | POA: Diagnosis not present

## 2022-07-26 DIAGNOSIS — E782 Mixed hyperlipidemia: Secondary | ICD-10-CM | POA: Diagnosis not present

## 2022-07-27 LAB — CMP14+EGFR
ALT: 16 IU/L (ref 0–32)
AST: 20 IU/L (ref 0–40)
Albumin: 4 g/dL (ref 3.8–4.9)
Alkaline Phosphatase: 109 IU/L (ref 44–121)
BUN/Creatinine Ratio: 17 (ref 9–23)
BUN: 13 mg/dL (ref 6–24)
Bilirubin Total: 0.3 mg/dL (ref 0.0–1.2)
CO2: 25 mmol/L (ref 20–29)
Calcium: 9.9 mg/dL (ref 8.7–10.2)
Chloride: 103 mmol/L (ref 96–106)
Creatinine, Ser: 0.76 mg/dL (ref 0.57–1.00)
Globulin, Total: 3.7 g/dL (ref 1.5–4.5)
Glucose: 87 mg/dL (ref 70–99)
Potassium: 4.3 mmol/L (ref 3.5–5.2)
Sodium: 140 mmol/L (ref 134–144)
Total Protein: 7.7 g/dL (ref 6.0–8.5)
eGFR: 92 mL/min/{1.73_m2} (ref >59)

## 2022-07-27 LAB — CBC WITH DIFFERENTIAL/PLATELET
Basophils Absolute: 0 10*3/uL (ref 0.0–0.2)
Basos: 0 %
EOS (ABSOLUTE): 0.1 10*3/uL (ref 0.0–0.4)
Eos: 1 %
Hematocrit: 42.6 % (ref 34.0–46.6)
Hemoglobin: 13.9 g/dL (ref 11.1–15.9)
Immature Grans (Abs): 0 10*3/uL (ref 0.0–0.1)
Immature Granulocytes: 0 %
Lymphocytes Absolute: 2.8 10*3/uL (ref 0.7–3.1)
Lymphs: 32 %
MCH: 28.7 pg (ref 26.6–33.0)
MCHC: 32.6 g/dL (ref 31.5–35.7)
MCV: 88 fL (ref 79–97)
Monocytes Absolute: 0.7 10*3/uL (ref 0.1–0.9)
Monocytes: 8 %
Neutrophils Absolute: 5.2 10*3/uL (ref 1.4–7.0)
Neutrophils: 59 %
Platelets: 313 10*3/uL (ref 150–450)
RBC: 4.84 x10E6/uL (ref 3.77–5.28)
RDW: 12.6 % (ref 11.7–15.4)
WBC: 8.9 10*3/uL (ref 3.4–10.8)

## 2022-07-27 LAB — LIPID PANEL
Chol/HDL Ratio: 2.7 ratio (ref 0.0–4.4)
Cholesterol, Total: 146 mg/dL (ref 100–199)
HDL: 55 mg/dL (ref >39)
LDL Chol Calc (NIH): 80 mg/dL (ref 0–99)
Triglycerides: 52 mg/dL (ref 0–149)
VLDL Cholesterol Cal: 11 mg/dL (ref 5–40)

## 2022-07-27 LAB — HEMOGLOBIN A1C
Est. average glucose Bld gHb Est-mCnc: 111 mg/dL
Hgb A1c MFr Bld: 5.5 % (ref 4.8–5.6)

## 2022-07-27 LAB — TSH+FREE T4
Free T4: 1.01 ng/dL (ref 0.82–1.77)
TSH: 0.2 u[IU]/mL — ABNORMAL LOW (ref 0.450–4.500)

## 2022-07-30 ENCOUNTER — Other Ambulatory Visit (HOSPITAL_COMMUNITY): Payer: Self-pay

## 2022-08-02 ENCOUNTER — Other Ambulatory Visit (HOSPITAL_COMMUNITY): Payer: Self-pay

## 2022-08-06 ENCOUNTER — Other Ambulatory Visit (HOSPITAL_COMMUNITY): Payer: Self-pay

## 2022-08-06 ENCOUNTER — Other Ambulatory Visit: Payer: Self-pay | Admitting: Internal Medicine

## 2022-08-06 ENCOUNTER — Other Ambulatory Visit: Payer: Self-pay

## 2022-08-06 DIAGNOSIS — I10 Essential (primary) hypertension: Secondary | ICD-10-CM

## 2022-08-06 DIAGNOSIS — K219 Gastro-esophageal reflux disease without esophagitis: Secondary | ICD-10-CM

## 2022-08-06 MED ORDER — PHENTERMINE HCL 37.5 MG PO TABS
37.5000 mg | ORAL_TABLET | Freq: Every day | ORAL | 0 refills | Status: DC
  Filled 2022-08-06: qty 30, 30d supply, fill #0

## 2022-08-07 ENCOUNTER — Other Ambulatory Visit (HOSPITAL_COMMUNITY): Payer: Self-pay

## 2022-08-13 ENCOUNTER — Encounter: Payer: Self-pay | Admitting: Internal Medicine

## 2022-08-16 ENCOUNTER — Other Ambulatory Visit (HOSPITAL_COMMUNITY): Payer: Self-pay

## 2022-08-16 ENCOUNTER — Other Ambulatory Visit: Payer: Self-pay

## 2022-08-20 ENCOUNTER — Telehealth (INDEPENDENT_AMBULATORY_CARE_PROVIDER_SITE_OTHER): Payer: 59 | Admitting: Psychiatry

## 2022-08-20 ENCOUNTER — Encounter (HOSPITAL_COMMUNITY): Payer: Self-pay | Admitting: Psychiatry

## 2022-08-20 DIAGNOSIS — F5102 Adjustment insomnia: Secondary | ICD-10-CM | POA: Diagnosis not present

## 2022-08-20 DIAGNOSIS — F331 Major depressive disorder, recurrent, moderate: Secondary | ICD-10-CM | POA: Diagnosis not present

## 2022-08-20 DIAGNOSIS — F4310 Post-traumatic stress disorder, unspecified: Secondary | ICD-10-CM | POA: Diagnosis not present
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
## 2022-08-27 ENCOUNTER — Encounter: Payer: Self-pay | Admitting: Internal Medicine

## 2022-08-27 ENCOUNTER — Ambulatory Visit: Payer: 59 | Admitting: Internal Medicine

## 2022-08-27 ENCOUNTER — Other Ambulatory Visit (HOSPITAL_COMMUNITY): Payer: Self-pay

## 2022-08-27 VITALS — BP 138/86 | HR 93 | Ht 61.0 in | Wt 212.8 lb

## 2022-08-27 DIAGNOSIS — I10 Essential (primary) hypertension: Secondary | ICD-10-CM

## 2022-08-27 DIAGNOSIS — E0590 Thyrotoxicosis, unspecified without thyrotoxic crisis or storm: Secondary | ICD-10-CM | POA: Diagnosis not present

## 2022-08-27 DIAGNOSIS — Z0001 Encounter for general adult medical examination with abnormal findings: Secondary | ICD-10-CM

## 2022-08-27 MED ORDER — PHENTERMINE HCL 37.5 MG PO TABS
37.5000 mg | ORAL_TABLET | Freq: Every day | ORAL | 3 refills | Status: DC
Start: 2022-08-27 — End: 2023-01-20
  Filled 2022-08-27 – 2022-09-07 (×2): qty 30, 30d supply, fill #0
  Filled 2022-10-09: qty 30, 30d supply, fill #1
  Filled 2022-11-07 (×2): qty 30, 30d supply, fill #2
  Filled 2022-12-07 (×2): qty 30, 30d supply, fill #3

## 2022-08-27 NOTE — Patient Instructions (Addendum)
Please continue to take medications as prescribed.  Please continue to follow DASH diet and perform moderate exercise/walking at least 150 mins/week. 

## 2022-08-27 NOTE — Assessment & Plan Note (Signed)
Lab Results  Component Value Date   TSH 0.200 (L) 07/26/2022   Followed by Endocrinology - lost follow up, has follow-up appointment now However symptoms do not align with hyperthyroidism, she actually has fatigue, weight gain and constipation Check TSH and free T4
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
## 2022-08-27 NOTE — Assessment & Plan Note (Signed)
Physical exam as documented. Fasting blood tests ordered. 

## 2022-08-27 NOTE — Assessment & Plan Note (Signed)
BP Readings from Last 1 Encounters:  08/27/22 138/86   Well-controlled with amlodipine 10 mg daily and losartan-HCTZ 100-25 mg QD Counseled for compliance with the medications Advised DASH diet and moderate exercise/walking, at least 150 mins/week

## 2022-08-28 ENCOUNTER — Encounter: Payer: Self-pay | Admitting: Obstetrics & Gynecology

## 2022-08-28 NOTE — Assessment & Plan Note (Signed)
BMI Readings from Last 3 Encounters:  08/27/22 40.21 kg/m  07/17/22 39.64 kg/m  04/12/22 41.49 kg/m   Has been trying to follow low-carb diet and performs exercise/walking as tolerated  - has cut down red meat and bread intake Was on metformin for prediabetes in the past Was on Wegovy - initial BMI 44.52, had been losing weight, but had to stop due to loss of coverage She is motivated to follow low-carb diet and perform moderate exercise/walking Qsymia was not covered On Phentermine 37.5 mg since 07/24

## 2022-09-07 ENCOUNTER — Other Ambulatory Visit (HOSPITAL_BASED_OUTPATIENT_CLINIC_OR_DEPARTMENT_OTHER): Payer: Self-pay

## 2022-09-07 ENCOUNTER — Other Ambulatory Visit: Payer: Self-pay

## 2022-09-14 ENCOUNTER — Other Ambulatory Visit: Payer: Self-pay

## 2022-09-14 ENCOUNTER — Ambulatory Visit (INDEPENDENT_AMBULATORY_CARE_PROVIDER_SITE_OTHER): Payer: 59 | Admitting: Nurse Practitioner

## 2022-09-14 ENCOUNTER — Encounter: Payer: Self-pay | Admitting: Nurse Practitioner

## 2022-09-14 VITALS — BP 108/75 | HR 90 | Ht 61.0 in | Wt 207.4 lb

## 2022-09-14 DIAGNOSIS — E0590 Thyrotoxicosis, unspecified without thyrotoxic crisis or storm: Secondary | ICD-10-CM

## 2022-09-14 MED ORDER — PROPRANOLOL HCL 10 MG PO TABS
10.0000 mg | ORAL_TABLET | Freq: Two times a day (BID) | ORAL | 0 refills | Status: DC
  Filled 2022-09-14: qty 180, 90d supply, fill #0
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
## 2022-09-27 ENCOUNTER — Encounter: Payer: Self-pay | Admitting: Nutrition

## 2022-09-27 ENCOUNTER — Encounter: Payer: 59 | Attending: Internal Medicine | Admitting: Nutrition

## 2022-09-27 DIAGNOSIS — I10 Essential (primary) hypertension: Secondary | ICD-10-CM | POA: Insufficient documentation

## 2022-09-27 NOTE — Patient Instructions (Signed)
Goals Established by Pt Eat three meals per day from whole plant based foods. Drink 5 bottles of water perday Increase walking towards getting in 150 minutes a week Keep working with therapist on stress eating. Don't skip meals. Lose 1 lb per week. Read food labels and avoid salt and processed foods. Use herbs and spices to season foods.

## 2022-09-27 NOTE — Progress Notes (Signed)
Medical Nutrition Therapy  Appointment Start time:  0800  Appointment End time:  0900 [employer] [phone]  Self Referral Wellness  Primary concerns today: Obesity and HTN Referral diagnosis: E66.9, I10.0 Preferred learning style: NO Preference  Learning readiness: Ready    NUTRITION ASSESSMENT  57 yr old bfemale self referred for wanting to lose weight and lower her BP. Doesn't want to go gastric bypass or weight loss surgery. Had been on a GLP and lose 30 lbs. Is now on Phenteramine. Walking twice a week. Admits to being emotional/stress eater.  Willing to work on [employer] to improve her health by losing weight and lower her BP and reduce risk of CVD.   Anthropometrics  Wt Readings from Last 3 Encounters:  09/27/22 210 lb (95.3 kg)  09/14/22 207 lb 6.4 oz (94.1 kg)  08/27/22 212 lb 12.8 oz (96.5 kg)   Ht Readings from Last 3 Encounters:  09/27/22 5\' 1"  (1.549 m)  09/14/22 5\' 1"  (1.549 m)  08/27/22 5\' 1"  (1.549 m)   Body mass index is 39.68 kg/m. @BMIFA @ Facility age limit for growth %iles is 20 years. Facility age limit for growth %iles is 20 years.   Clinical Medical Hx: See chart Medications: See chart Labs:     Latest Ref Rng & Units 07/26/2022   10:45 AM 11/21/2020   11:16 AM 06/09/2020    5:18 PM  CMP  Glucose 70 - 99 mg/dL 87  87  83   BUN 6 - 24 mg/dL 13  12  16    Creatinine 0.57 - 1.00 mg/dL 6.04  5.40  9.81   Sodium 134 - 144 mmol/L 140  140  139   Potassium 3.5 - 5.2 mmol/L 4.3  4.2  3.5   Chloride 96 - 106 mmol/L 103  99  99   CO2 20 - 29 mmol/L 25  26  32   Calcium 8.7 - 10.2 mg/dL 9.9  9.9  9.7   Total Protein 6.0 - 8.5 g/dL 7.7  7.8  7.8   Total Bilirubin 0.0 - 1.2 mg/dL 0.3  0.4  0.3   Alkaline Phos 44 - 121 IU/L 109  98    AST 0 - 40 IU/L 20  18  12    ALT 0 - 32 IU/L 16  10  11     Lipid Panel     Component Value Date/Time   CHOL 146 07/26/2022 1045   TRIG 52 07/26/2022 1045   HDL 55 07/26/2022 1045   CHOLHDL 2.7 07/26/2022 1045    CHOLHDL 3.1 02/10/2020 0000   LDLCALC 80 07/26/2022 1045   LDLCALC 79 02/10/2020 0000   LABVLDL 11 07/26/2022 1045   Lab Results  Component Value Date   HGBA1C 5.5 07/26/2022    Notable Signs/Symptoms: Tired,   Lifestyle & Dietary Hx Lives by herself.  Eats meals at home  Estimated daily fluid intake: 80 oz Supplements: Vit D Sleep: 4-5 Stress / self-care: Mild Current average weekly physical activity: Twice a week.   24-Hr Dietary Recall Eats 2 meals per day Drinks 4 bottles of water per day  Estimated Energy Needs Calories: 1200 Carbohydrate: 135g Protein: 90g Fat: 33g   NUTRITION DIAGNOSIS  NI-1.7 Predicted excessive energy intake As related to Obesity and HTN.  As evidenced by BMI 39 and elevated BP.Marland Kitchen   NUTRITION INTERVENTION  Nutrition education (E-1) on the following topics:  Nutrition and Diabetes education provided on My Plate, CHO counting, meal planning, portion sizes, timing of meals,  benefits of exercising 30 minutes per day and prevention of DM. Lifestyle Medicine  - Whole Food, Plant Predominant Nutrition is highly recommended: Eat Plenty of vegetables, Mushrooms, fruits, Legumes, Whole Grains, Nuts, seeds in lieu of processed meats, processed snacks/pastries red meat, poultry, eggs.    -It is better to avoid simple carbohydrates including: Cakes, Sweet Desserts, Ice Cream, Soda (diet and regular), Sweet Tea, Candies, Chips, Cookies, Store Bought Juices, Alcohol in Excess of  1-2 drinks a day, Lemonade,  Artificial Sweeteners, Doughnuts, Coffee Creamers, "Sugar-free" Products, etc, etc.  This is not a complete list.....  Exercise: If you are able: 30 -60 minutes a day ,4 days a week, or 150 minutes a week.  The longer the better.  Combine stretch, strength, and aerobic activities.  If you were told in the past that you have high risk for cardiovascular diseases, you may seek evaluation by your heart doctor prior to initiating moderate to intense  exercise programs.   Handouts Provided Include  Lifestyle Medicine Herbs and spice handout  Learning Style & Readiness for Change Teaching method utilized: Visual & Auditory  Demonstrated degree of understanding via: Teach Back  Barriers to learning/adherence to lifestyle change: none  Goals Established by Pt Eat three meals per day from whole plant based foods. Drink 5 bottles of water per day Increase walking towards getting in 150 minutes a week Keep working with therapist on stress eating. Don't skip meals. Lose 1 lb per week. Read food labels and avoid salt and processed foods. Use herbs and spices to season foods.   MONITORING & EVALUATION Dietary intake, weekly physical activity, and weight in 1 week  Next Steps  Patient is to work on meal planning  and exercise.Marland Kitchen

## 2022-09-28 ENCOUNTER — Encounter: Payer: Self-pay | Admitting: Internal Medicine

## 2022-09-28 NOTE — Telephone Encounter (Signed)
Appt made/ pt aware  

## 2022-10-02 ENCOUNTER — Ambulatory Visit (INDEPENDENT_AMBULATORY_CARE_PROVIDER_SITE_OTHER): Payer: 59 | Admitting: Internal Medicine

## 2022-10-02 ENCOUNTER — Encounter: Payer: Self-pay | Admitting: Internal Medicine

## 2022-10-02 ENCOUNTER — Other Ambulatory Visit (HOSPITAL_COMMUNITY): Payer: Self-pay

## 2022-10-02 VITALS — BP 121/77 | HR 88 | Ht 61.0 in | Wt 209.0 lb

## 2022-10-02 DIAGNOSIS — M5030 Other cervical disc degeneration, unspecified cervical region: Secondary | ICD-10-CM | POA: Diagnosis not present

## 2022-10-02 DIAGNOSIS — Z23 Encounter for immunization: Secondary | ICD-10-CM

## 2022-10-02 DIAGNOSIS — I10 Essential (primary) hypertension: Secondary | ICD-10-CM

## 2022-10-02 MED ORDER — CYCLOBENZAPRINE HCL 5 MG PO TABS
5.0000 mg | ORAL_TABLET | Freq: Two times a day (BID) | ORAL | 1 refills | Status: AC | PRN
Start: 2022-10-02 — End: ?
  Filled 2022-10-02: qty 30, 15d supply, fill #0

## 2022-10-02 MED ORDER — MELOXICAM 7.5 MG PO TABS
7.5000 mg | ORAL_TABLET | Freq: Every day | ORAL | 1 refills | Status: DC | PRN
Start: 2022-10-02 — End: 2023-09-17
  Filled 2022-10-02: qty 30, 30d supply, fill #0
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
## 2022-10-02 NOTE — Assessment & Plan Note (Signed)
BP Readings from Last 1 Encounters:  10/02/22 121/77   Well-controlled with amlodipine 10 mg QD and losartan-HCTZ 100-25 mg QD 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, now resolved Counseled for compliance with the medications Advised DASH diet and moderate exercise/walking, at least 150 mins/week

## 2022-10-02 NOTE — Assessment & Plan Note (Signed)
BMI Readings from Last 3 Encounters:  10/02/22 39.49 kg/m  09/27/22 39.68 kg/m  09/14/22 39.19 kg/m   Has been trying to follow low-carb diet and performs exercise/walking as tolerated  - has cut down red meat and bread intake Was on metformin for prediabetes in the past Was on Wegovy - initial BMI 44.52, had been losing weight, but had to stop due to loss of coverage She is motivated to follow low-carb diet and perform moderate exercise/walking Qsymia was not covered On Phentermine 37.5 mg since 07/24

## 2022-10-02 NOTE — Assessment & Plan Note (Signed)
Her neck pain is likely due to DDD of cervical spine in addition to neck muscle strain Mobic as needed for pain Flexeril as needed for muscle spasms Can apply warm compresses/heating pad Avoid prolonged bending of neck If persistent, may need MRI of cervical spine

## 2022-10-02 NOTE — Patient Instructions (Signed)
Please take Meloxicam as needed for neck pain.  Please take Flexeril as needed for neck stiffness or muscle spasms.  Please avoid prolonged neck bending.

## 2022-10-03 ENCOUNTER — Other Ambulatory Visit: Payer: Self-pay

## 2022-10-04 ENCOUNTER — Encounter: Payer: Self-pay | Admitting: Nutrition

## 2022-10-04 ENCOUNTER — Encounter: Payer: 59 | Admitting: Nutrition

## 2022-10-04 DIAGNOSIS — I10 Essential (primary) hypertension: Secondary | ICD-10-CM

## 2022-10-04 NOTE — Patient Instructions (Signed)
Goals  Work on sleep habits-Cut off TV and phones 7 pm Cut out sweets and caffeine and salt intake Increase walking to 3 days per week Aim to lose 2 lbs per month Increase fruits, vegetables and whole grains Cut back on honey and granola

## 2022-10-04 NOTE — Progress Notes (Signed)
Medical Nutrition Therapy   Second visit Appointment Start time:  [phone]  Appointment End time:  1615 [employer] [phone]  Self Referral Wellness  Primary concerns today: Obesity and HTN Referral diagnosis: E66.9, I10.0 Preferred learning style: NO Preference  Learning readiness: Ready    NUTRITION ASSESSMENT Obesity and HTN follow up  Is working on eating better. Has been eating Healthy Choice Power Bowl meals. Feels better. GI is improving with less bloating. Walking some. Cutting out sugar and salty foods.   Goals Established by Pt Eat three meals per day from whole plant based foods- done Drink 5 bottles of water per day- drinking 4 bottles per day Increase walking towards getting in 150 minutes a week- walking 30 minutes 3 times.  Keep working with therapist on stress eating.--work in progress Don't skip meals.-done Lose 1 lb per week.-no weight loss yet. Read food labels and avoid salt and processed foods.-done Use herbs and spices to season foods.-working on it.  Willing to work on [employer] to improve her health by losing weight and lower her BP and reduce risk of CVD.   Anthropometrics  Wt Readings from Last 3 Encounters:  10/02/22 209 lb (94.8 kg)  09/27/22 210 lb (95.3 kg)  09/14/22 207 lb 6.4 oz (94.1 kg)   Ht Readings from Last 3 Encounters:  10/02/22 5\' 1"  (1.549 m)  09/27/22 5\' 1"  (1.549 m)  09/14/22 5\' 1"  (1.549 m)   There is no height or weight on file to calculate BMI. @BMIFA @ Facility age limit for growth %iles is 20 years. Facility age limit for growth %iles is 20 years.   Clinical Medical Hx: See chart Medications: See chart Labs:     Latest Ref Rng & Units 07/26/2022   10:45 AM 11/21/2020   11:16 AM 06/09/2020    5:18 PM  CMP  Glucose 70 - 99 mg/dL 87  87  83   BUN 6 - 24 mg/dL 13  12  16    Creatinine 0.57 - 1.00 mg/dL 9.56  3.87  5.64   Sodium 134 - 144 mmol/L 140  140  139   Potassium 3.5 - 5.2 mmol/L 4.3  4.2  3.5   Chloride 96  - 106 mmol/L 103  99  99   CO2 20 - 29 mmol/L 25  26  32   Calcium 8.7 - 10.2 mg/dL 9.9  9.9  9.7   Total Protein 6.0 - 8.5 g/dL 7.7  7.8  7.8   Total Bilirubin 0.0 - 1.2 mg/dL 0.3  0.4  0.3   Alkaline Phos 44 - 121 IU/L 109  98    AST 0 - 40 IU/L 20  18  12    ALT 0 - 32 IU/L 16  10  11     Lipid Panel     Component Value Date/Time   CHOL 146 07/26/2022 1045   TRIG 52 07/26/2022 1045   HDL 55 07/26/2022 1045   CHOLHDL 2.7 07/26/2022 1045   CHOLHDL 3.1 02/10/2020 0000   LDLCALC 80 07/26/2022 1045   LDLCALC 79 02/10/2020 0000   LABVLDL 11 07/26/2022 1045   Lab Results  Component Value Date   HGBA1C 5.5 07/26/2022    Notable Signs/Symptoms: Tired,   Lifestyle & Dietary Hx Lives by herself.  Eats meals at home  Estimated daily fluid intake: 80 oz Supplements: Vit D Sleep: 4-5 Stress / self-care: Mild Current average weekly physical activity: Twice a week.   24-Hr Dietary Recall Eats 2 meals per  day Drinks 4 bottles of water per day  Estimated Energy Needs Calories: 1200 Carbohydrate: 135g Protein: 90g Fat: 33g   NUTRITION DIAGNOSIS  NI-1.7 Predicted excessive energy intake As related to Obesity and HTN.  As evidenced by BMI 39 and elevated BP.Marland Kitchen   NUTRITION INTERVENTION  Nutrition education (E-1) on the following topics:  Nutrition and Diabetes education provided on My Plate, CHO counting, meal planning, portion sizes, timing of meals,  benefits of exercising 30 minutes per day and prevention of DM. Lifestyle Medicine  - Whole Food, Plant Predominant Nutrition is highly recommended: Eat Plenty of vegetables, Mushrooms, fruits, Legumes, Whole Grains, Nuts, seeds in lieu of processed meats, processed snacks/pastries red meat, poultry, eggs.    -It is better to avoid simple carbohydrates including: Cakes, Sweet Desserts, Ice Cream, Soda (diet and regular), Sweet Tea, Candies, Chips, Cookies, Store Bought Juices, Alcohol in Excess of  1-2 drinks a day, Lemonade,   Artificial Sweeteners, Doughnuts, Coffee Creamers, "Sugar-free" Products, etc, etc.  This is not a complete list.....  Exercise: If you are able: 30 -60 minutes a day ,4 days a week, or 150 minutes a week.  The longer the better.  Combine stretch, strength, and aerobic activities.  If you were told in the past that you have high risk for cardiovascular diseases, you may seek evaluation by your heart doctor prior to initiating moderate to intense exercise programs.   Handouts Provided Include  Lifestyle Medicine Herbs and spice handout  Learning Style & Readiness for Change Teaching method utilized: Visual & Auditory  Demonstrated degree of understanding via: Teach Back  Barriers to learning/adherence to lifestyle change: none    Goals  Work on sleep habits-Cut off TV and phones 7 pm Cut out sweets and caffeine and salt intake Increase walking to 3 days per week Aim to lose 2 lbs per month Increase fruits, vegetables and whole grains Cut back on honey and granola MONITORING & EVALUATION Dietary intake, weekly physical activity, and weight in 2 months  Next Steps  Patient is to work on meal planning  and exercise.Marland Kitchen

## 2022-10-09 ENCOUNTER — Other Ambulatory Visit (HOSPITAL_COMMUNITY): Payer: Self-pay

## 2022-10-22 ENCOUNTER — Telehealth (HOSPITAL_COMMUNITY): Payer: Self-pay | Admitting: Psychiatry

## 2022-10-22 NOTE — Telephone Encounter (Signed)
Called to inform patient of appointment cancellation. No answer and voicemail not set up. Message sent to patient via MyChart.

## 2022-10-26 ENCOUNTER — Telehealth (HOSPITAL_COMMUNITY): Payer: 59 | Admitting: Psychiatry

## 2022-10-30 ENCOUNTER — Telehealth (HOSPITAL_COMMUNITY): Payer: Self-pay | Admitting: Psychiatry

## 2022-10-30 ENCOUNTER — Encounter (HOSPITAL_COMMUNITY): Payer: Self-pay | Admitting: Psychiatry

## 2022-10-30 NOTE — Telephone Encounter (Signed)
Called patient after receiving MyChart message requesting reschedule of last week's appointment. Patient also requested letter from provider to be able to keep her 2 dogs as emotional support animals in her apartment. States landlord has spoken with her yesterday about complaints from a neighbor about her dogs. She indicated this made her upset and she had to miss work today. Tearful during call, afraid she might lose her animals.Requests letter be emailed to her if provided.

## 2022-11-07 ENCOUNTER — Other Ambulatory Visit (HOSPITAL_BASED_OUTPATIENT_CLINIC_OR_DEPARTMENT_OTHER): Payer: Self-pay

## 2022-11-07 ENCOUNTER — Other Ambulatory Visit (HOSPITAL_COMMUNITY): Payer: Self-pay

## 2022-11-07 ENCOUNTER — Other Ambulatory Visit: Payer: Self-pay

## 2022-11-08 ENCOUNTER — Encounter: Payer: Self-pay | Admitting: Internal Medicine

## 2022-11-08 ENCOUNTER — Other Ambulatory Visit (HOSPITAL_COMMUNITY): Payer: Self-pay

## 2022-11-14 ENCOUNTER — Other Ambulatory Visit: Payer: Self-pay

## 2022-11-14 ENCOUNTER — Telehealth (INDEPENDENT_AMBULATORY_CARE_PROVIDER_SITE_OTHER): Payer: 59 | Admitting: Psychiatry

## 2022-11-14 ENCOUNTER — Encounter (HOSPITAL_COMMUNITY): Payer: Self-pay | Admitting: Psychiatry

## 2022-11-14 DIAGNOSIS — F331 Major depressive disorder, recurrent, moderate: Secondary | ICD-10-CM

## 2022-11-14 DIAGNOSIS — F4310 Post-traumatic stress disorder, unspecified: Secondary | ICD-10-CM | POA: Diagnosis not present

## 2022-11-14 DIAGNOSIS — F5102 Adjustment insomnia: Secondary | ICD-10-CM

## 2022-11-14 MED ORDER — LAMOTRIGINE 25 MG PO TABS
25.0000 mg | ORAL_TABLET | Freq: Every day | ORAL | 0 refills | Status: DC
  Filled 2022-11-14: qty 60, 30d supply, fill #0

## 2022-11-14 MED ORDER — ESCITALOPRAM OXALATE 20 MG PO TABS
20.0000 mg | ORAL_TABLET | Freq: Every day | ORAL | 0 refills | Status: DC
  Filled 2022-11-14: qty 30, 30d supply, fill #0
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
## 2022-11-15 ENCOUNTER — Other Ambulatory Visit: Payer: Self-pay

## 2022-12-04 ENCOUNTER — Other Ambulatory Visit (HOSPITAL_COMMUNITY): Payer: Self-pay | Admitting: Internal Medicine

## 2022-12-04 DIAGNOSIS — Z1231 Encounter for screening mammogram for malignant neoplasm of breast: Secondary | ICD-10-CM

## 2022-12-07 ENCOUNTER — Other Ambulatory Visit: Payer: Self-pay | Admitting: Internal Medicine

## 2022-12-07 ENCOUNTER — Other Ambulatory Visit (HOSPITAL_COMMUNITY): Payer: Self-pay

## 2022-12-07 ENCOUNTER — Other Ambulatory Visit: Payer: Self-pay

## 2022-12-07 ENCOUNTER — Other Ambulatory Visit (HOSPITAL_COMMUNITY): Payer: Self-pay | Admitting: Psychiatry

## 2022-12-07 MED ORDER — ESCITALOPRAM OXALATE 20 MG PO TABS
20.0000 mg | ORAL_TABLET | Freq: Every day | ORAL | 0 refills | Status: DC
  Filled 2022-12-07: qty 30, 30d supply, fill #0

## 2022-12-07 MED ORDER — PROPRANOLOL HCL 10 MG PO TABS
10.0000 mg | ORAL_TABLET | Freq: Two times a day (BID) | ORAL | 0 refills | Status: AC
  Filled 2022-12-07: qty 180, 90d supply, fill #0

## 2022-12-08 ENCOUNTER — Other Ambulatory Visit (HOSPITAL_COMMUNITY): Payer: Self-pay

## 2022-12-10 ENCOUNTER — Ambulatory Visit (HOSPITAL_COMMUNITY): Payer: 59

## 2022-12-10 ENCOUNTER — Other Ambulatory Visit: Payer: Self-pay

## 2022-12-11 ENCOUNTER — Ambulatory Visit: Payer: 59 | Admitting: Nutrition

## 2022-12-17 ENCOUNTER — Ambulatory Visit (HOSPITAL_COMMUNITY)
Admission: RE | Admit: 2022-12-17 | Discharge: 2022-12-17 | Disposition: A | Discharge status: Home / Self Care | Payer: 59 | Source: Ambulatory Visit | Attending: Internal Medicine | Admitting: Internal Medicine

## 2022-12-17 DIAGNOSIS — Z1231 Encounter for screening mammogram for malignant neoplasm of breast: Secondary | ICD-10-CM | POA: Insufficient documentation

## 2022-12-24 ENCOUNTER — Encounter (HOSPITAL_COMMUNITY): Payer: Self-pay | Admitting: Psychiatry

## 2022-12-24 ENCOUNTER — Other Ambulatory Visit (HOSPITAL_COMMUNITY): Payer: Self-pay

## 2022-12-24 ENCOUNTER — Telehealth (INDEPENDENT_AMBULATORY_CARE_PROVIDER_SITE_OTHER): Payer: 59 | Admitting: Psychiatry

## 2022-12-24 DIAGNOSIS — F5102 Adjustment insomnia: Secondary | ICD-10-CM

## 2022-12-24 DIAGNOSIS — F411 Generalized anxiety disorder: Secondary | ICD-10-CM

## 2022-12-24 DIAGNOSIS — F332 Major depressive disorder, recurrent severe without psychotic features: Secondary | ICD-10-CM

## 2022-12-24 DIAGNOSIS — F4310 Post-traumatic stress disorder, unspecified: Secondary | ICD-10-CM

## 2022-12-24 DIAGNOSIS — F331 Major depressive disorder, recurrent, moderate: Secondary | ICD-10-CM

## 2022-12-24 MED ORDER — LAMOTRIGINE 25 MG PO TABS
50.0000 mg | ORAL_TABLET | Freq: Every day | ORAL | 2 refills | Status: DC
  Filled 2022-12-24: qty 60, 30d supply, fill #0
  Filled 2023-02-20: qty 60, 30d supply, fill #1

## 2022-12-24 MED ORDER — ESCITALOPRAM OXALATE 20 MG PO TABS
20.0000 mg | ORAL_TABLET | Freq: Every day | ORAL | 1 refills | Status: DC
  Filled 2022-12-24: qty 30, 30d supply, fill #0
  Filled 2023-02-20: qty 30, 30d supply, fill #1
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
## 2022-12-31 ENCOUNTER — Ambulatory Visit: Payer: 59 | Admitting: Internal Medicine

## 2023-01-10 ENCOUNTER — Encounter: Payer: Self-pay | Admitting: Internal Medicine

## 2023-01-15 ENCOUNTER — Ambulatory Visit: Payer: 59 | Admitting: Nurse Practitioner

## 2023-01-20 ENCOUNTER — Other Ambulatory Visit: Payer: Self-pay | Admitting: Internal Medicine

## 2023-01-21 ENCOUNTER — Other Ambulatory Visit (HOSPITAL_COMMUNITY): Payer: Self-pay

## 2023-01-21 ENCOUNTER — Other Ambulatory Visit: Payer: Self-pay | Admitting: Internal Medicine

## 2023-01-21 ENCOUNTER — Other Ambulatory Visit: Payer: Self-pay

## 2023-01-21 MED ORDER — PHENTERMINE HCL 37.5 MG PO TABS
37.5000 mg | ORAL_TABLET | Freq: Every day | ORAL | 1 refills | Status: DC
  Filled 2023-01-21 (×2): qty 30, 30d supply, fill #0
  Filled 2023-02-20: qty 30, 30d supply, fill #1

## 2023-01-22 ENCOUNTER — Ambulatory Visit: Payer: 59 | Admitting: Nutrition

## 2023-02-20 ENCOUNTER — Other Ambulatory Visit (HOSPITAL_COMMUNITY): Payer: Self-pay

## 2023-02-28 ENCOUNTER — Other Ambulatory Visit: Payer: Self-pay | Admitting: Internal Medicine

## 2023-02-28 MED ORDER — PHENTERMINE HCL 37.5 MG PO TABS: 37.5000 mg | ORAL_TABLET | Freq: Every day | ORAL | 1 refills | Status: DC

## 2023-02-28 NOTE — Telephone Encounter (Signed)
Copied from CRM [id number]. Topic: Clinical - Medication Refill >> Feb 28, 2023  2:53 PM Thomes Dinning wrote: Most Recent Primary Care Visit:  Provider: Anabel Halon  Department: [facility]  Visit Type: OFFICE VISIT  Date: 10/02/2022  Medication: phentermine (ADIPEX-P) 37.5 MG tablet  Has the patient contacted their pharmacy? Yes (Agent: If no, request that the patient contact the pharmacy for the refill. If patient does not wish to contact the pharmacy document the reason why and proceed with request.) (Agent: If yes, when and what did the pharmacy advise?)  Is this the correct pharmacy for this prescription? Yes If no, delete pharmacy and type the correct one.  This is the patient's preferred pharmacy:   [facility] [address] [place] [address] Phone: [phone] Fax: [phone]   Has the prescription been filled recently? Yes  Is the patient out of the medication? Yes  Has the patient been seen for an appointment in the last year OR does the patient have an upcoming appointment? Yes  Can we respond through MyChart? Yes  Agent: Please be advised that Rx refills may take up to 3 business days. We ask that you follow-up with your pharmacy.

## 2023-02-28 NOTE — Telephone Encounter (Signed)
Last Fill: 01/21/2023  Last OV: 09/29/22 Next OV: 03/07/23  Routing to provider for review/authorization.

## 2023-03-07 ENCOUNTER — Encounter: Payer: Self-pay | Admitting: Internal Medicine

## 2023-03-07 ENCOUNTER — Other Ambulatory Visit (HOSPITAL_COMMUNITY): Payer: Self-pay

## 2023-03-07 ENCOUNTER — Ambulatory Visit: Payer: Commercial Managed Care - PPO | Admitting: Internal Medicine

## 2023-03-07 DIAGNOSIS — F331 Major depressive disorder, recurrent, moderate: Secondary | ICD-10-CM

## 2023-03-07 DIAGNOSIS — I10 Essential (primary) hypertension: Secondary | ICD-10-CM | POA: Diagnosis not present

## 2023-03-07 MED ORDER — CONTRAVE 8-90 MG PO TB12
ORAL_TABLET | ORAL | 0 refills | Status: DC
  Filled 2023-03-07: qty 120, 30d supply, fill #0

## 2023-03-07 NOTE — Patient Instructions (Signed)
Please start taking Contrave as prescribed.  Please access coupon from this website.  [email or web address]  Please take Phentermine half tablet once daily for 1 week and then stop taking Phentermine.  Please continue to take other medications as prescribed.  Please continue to follow low carb diet and perform moderate exercise/walking at least 150 mins/week.
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
## 2023-03-08 ENCOUNTER — Other Ambulatory Visit: Payer: Self-pay

## 2023-03-08 MED ORDER — CONTRAVE 8-90 MG PO TB12: ORAL_TABLET | ORAL | 0 refills | Status: DC

## 2023-03-08 NOTE — Assessment & Plan Note (Signed)
BP Readings from Last 1 Encounters:  03/07/23 115/78   Well-controlled with amlodipine 10 mg QD and losartan-HCTZ 100-25 mg QD 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, now resolved Counseled for compliance with the medications Advised DASH diet and moderate exercise/walking, at least 150 mins/week

## 2023-03-08 NOTE — Assessment & Plan Note (Signed)
BMI Readings from Last 3 Encounters:  03/07/23 40.81 kg/m  10/04/22 39.75 kg/m  10/02/22 39.49 kg/m   Has been trying to follow low-carb diet and performs exercise/walking as tolerated for the last 1 year  - has cut down red meat and bread intake Was on metformin for prediabetes in the past Was on Wegovy - initial BMI 44.52, had been losing weight, but had to stop due to loss of coverage She is motivated to follow low-carb diet and perform moderate exercise/walking Qsymia was not covered On Phentermine 37.5 mg since 07/24, discontinued it due to lack of efficacy now Started Contrave -provided information about [employer] program

## 2023-03-08 NOTE — Assessment & Plan Note (Signed)
Well controlled with Lexapro 15 mg daily Followed by psychiatry 

## 2023-03-11 ENCOUNTER — Ambulatory Visit: Payer: 59 | Admitting: Nutrition

## 2023-03-25 ENCOUNTER — Encounter (HOSPITAL_COMMUNITY): Payer: Self-pay | Admitting: Psychiatry

## 2023-03-25 ENCOUNTER — Other Ambulatory Visit: Payer: Self-pay

## 2023-03-25 ENCOUNTER — Other Ambulatory Visit (HOSPITAL_COMMUNITY): Payer: Self-pay

## 2023-03-25 ENCOUNTER — Telehealth (INDEPENDENT_AMBULATORY_CARE_PROVIDER_SITE_OTHER): Payer: Commercial Managed Care - PPO | Admitting: Psychiatry

## 2023-03-25 DIAGNOSIS — F4310 Post-traumatic stress disorder, unspecified: Secondary | ICD-10-CM

## 2023-03-25 DIAGNOSIS — F331 Major depressive disorder, recurrent, moderate: Secondary | ICD-10-CM

## 2023-03-25 DIAGNOSIS — F5102 Adjustment insomnia: Secondary | ICD-10-CM

## 2023-03-25 MED ORDER — LAMOTRIGINE 25 MG PO TABS
50.0000 mg | ORAL_TABLET | Freq: Every day | ORAL | 2 refills | Status: DC
  Filled 2023-03-25: qty 60, 30d supply, fill #0
  Filled 2023-05-07: qty 60, 30d supply, fill #1

## 2023-03-25 MED ORDER — ESCITALOPRAM OXALATE 20 MG PO TABS
20.0000 mg | ORAL_TABLET | Freq: Every day | ORAL | 2 refills | Status: DC
  Filled 2023-03-25: qty 30, 30d supply, fill #0
  Filled 2023-05-07: qty 30, 30d supply, fill #1
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
## 2023-04-07 ENCOUNTER — Telehealth: Admitting: Physician Assistant

## 2023-04-07 ENCOUNTER — Encounter

## 2023-04-08 ENCOUNTER — Telehealth: Admitting: Family Medicine

## 2023-04-08 DIAGNOSIS — R6889 Other general symptoms and signs: Secondary | ICD-10-CM

## 2023-04-08 DIAGNOSIS — J4520 Mild intermittent asthma, uncomplicated: Secondary | ICD-10-CM

## 2023-04-08 MED ORDER — ALBUTEROL SULFATE HFA 108 (90 BASE) MCG/ACT IN AERS
1.0000 | INHALATION_SPRAY | Freq: Four times a day (QID) | RESPIRATORY_TRACT | 0 refills | Status: AC | PRN
Start: 2023-04-08 — End: ?

## 2023-04-08 MED ORDER — PROMETHAZINE-DM 6.25-15 MG/5ML PO SYRP
5.0000 mL | ORAL_SOLUTION | Freq: Four times a day (QID) | ORAL | 0 refills | Status: DC | PRN
Start: 2023-04-08 — End: 2023-06-05

## 2023-04-08 MED ORDER — BENZONATATE 100 MG PO CAPS: 100.0000 mg | ORAL_CAPSULE | Freq: Three times a day (TID) | ORAL | 0 refills | Status: AC | PRN

## 2023-04-08 NOTE — Progress Notes (Signed)
 Virtual Visit Consent   Kristina Bonilla, you are scheduled for a virtual visit with a [facility] provider today. Just as with appointments in the office, your consent must be obtained to participate. Your consent will be active for this visit and any virtual visit you may have with one of our providers in the next 365 days. If you have a MyChart account, a copy of this consent can be sent to you electronically.  As this is a virtual visit, video technology does not allow for your provider to perform a traditional examination. This may limit your provider's ability to fully assess your condition. If your provider identifies any concerns that need to be evaluated in person or the need to arrange testing (such as labs, EKG, etc.), we will make arrangements to do so. Although advances in technology are sophisticated, we cannot ensure that it will always work on either your end or our end. If the connection with a video visit is poor, the visit may have to be switched to a telephone visit. With either a video or telephone visit, we are not always able to ensure that we have a secure connection.  By engaging in this virtual visit, you consent to the provision of healthcare and authorize for your insurance to be billed (if applicable) for the services provided during this visit. Depending on your insurance coverage, you may receive a charge related to this service.  I need to obtain your verbal consent now. Are you willing to proceed with your visit today? Kristina Bonilla has provided verbal consent on 04/08/2023 for a virtual visit (video or telephone). Freddy Finner, NP  Date: 04/08/2023 9:03 AM   Virtual Visit via Video Note   I, Freddy Finner, connected with  Kristina Bonilla  ([id number], April 24, 1965) on 04/08/23 at  9:00 AM EST by a video-enabled telemedicine application and verified that I am speaking with the correct person using two identifiers.  Location: Patient: Virtual Visit Location Patient:  Home Provider: Virtual Visit Location Provider: Home Office   I discussed the limitations of evaluation and management by telemedicine and the availability of in person appointments. The patient expressed understanding and agreed to proceed.    History of Present Illness: Kristina Bonilla is a 58 y.o. who identifies as a female who was assigned female at birth, and is being seen today for cough.  Onset was Friday with cough and congestion  Associated symptoms are bodyaches, fever (improved), chills, cough with mucus is yellow Modifying factors are tylenol, advil- day and nyquil  Denies chest pain, shortness of breath  Exposure to sick contacts- known- co worker had flu   Problems:  Patient Active Problem List   Diagnosis Date Noted   DDD (degenerative disc disease), cervical 10/02/2022   Chest pain 04/12/2022   Primary osteoarthritis of both knees 04/12/2022   Encounter for general adult medical examination with abnormal findings 08/09/2021   Subclinical hyperthyroidism 11/22/2020   Essential hypertension 11/17/2020   Prediabetes 11/17/2020   MDD (major depressive disorder), recurrent episode, moderate (HCC) 11/17/2020   GERD (gastroesophageal reflux disease) 11/17/2020   Lipoma 11/17/2020   Urticaria 01/05/2020   Mild intermittent asthma without complication 01/05/2020   Other allergic rhinitis 01/05/2020   Fibroids 11/25/2019   Hot flashes 10/19/2019   S/P endometrial ablation 10/19/2019   Morbid obesity (HCC) 02/13/2017    Allergies:  Allergies  Allergen Reactions   Latex Itching   Lisinopril     cough  Medications:  Current Outpatient Medications:    amLODipine (NORVASC) 10 MG tablet, Take 1 tablet (10 mg total) by mouth daily., Disp: 90 tablet, Rfl: 3   cetirizine (ZYRTEC) 10 MG tablet, TAKE 1 TABLET (10 MG TOTAL) BY MOUTH AT BEDTIME., Disp: 30 tablet, Rfl: 5   Cholecalciferol (VITAMIN D3) 125 MCG (5000 UT) TABS, Take 2 tablets (10,000 Units total) by mouth daily at  12 noon., Disp: 30 tablet, Rfl: 2   cyclobenzaprine (FLEXERIL) 5 MG tablet, Take 1 tablet (5 mg total) by mouth 2 (two) times daily as needed for muscle spasms., Disp: 30 tablet, Rfl: 1   EPINEPHrine 0.3 mg/0.3 mL IJ SOAJ injection, Inject 0.3 mg into the muscle as needed., Disp: 1 each, Rfl: 2   escitalopram (LEXAPRO) 20 MG tablet, Take 1 tablet (20 mg total) by mouth daily., Disp: 30 tablet, Rfl: 2   famotidine (PEPCID) 20 MG tablet, Take 1 tablet (20 mg total) by mouth at bedtime., Disp: 90 tablet, Rfl: 3   lamoTRIgine (LAMICTAL) 25 MG tablet, Take 2 tablets (50 mg total) by mouth daily., Disp: 60 tablet, Rfl: 2   losartan-hydrochlorothiazide (HYZAAR) 100-25 MG tablet, Take 1 tablet by mouth daily., Disp: 90 tablet, Rfl: 3   meloxicam (MOBIC) 7.5 MG tablet, Take 1 tablet (7.5 mg total) by mouth daily as needed for pain., Disp: 30 tablet, Rfl: 1   Naltrexone-buPROPion HCl ER (CONTRAVE) 8-90 MG TB12, Start 1 tablet every morning for 7 days, then 1 tablet twice daily for 7 days, then 2 tablets every morning and one every evening for 7 days, then 2 tablets every morning and every evening., Disp: 120 tablet, Rfl: 0   ondansetron (ZOFRAN) 4 MG tablet, Take 1 tablet (4 mg total) by mouth every 8 (eight) hours as needed for nausea or vomiting., Disp: 20 tablet, Rfl: 0   propranolol (INDERAL) 10 MG tablet, Take 1 tablet (10 mg total) by mouth 2 (two) times daily., Disp: 180 tablet, Rfl: 0  Observations/Objective: Patient is well-developed, well-nourished in no acute distress.  Resting comfortably  at home.  Head is normocephalic, atraumatic.  No labored breathing.  Speech is clear and coherent with logical content.  Patient is alert and oriented at baseline.  Cough present   Assessment and Plan:  1. Flu-like symptoms (Primary)  - promethazine-dextromethorphan (PROMETHAZINE-DM) 6.25-15 MG/5ML syrup; Take 5 mLs by mouth 4 (four) times daily as needed for cough.  Dispense: 118 mL; Refill: 0 -  benzonatate (TESSALON) 100 MG capsule; Take 1 capsule (100 mg total) by mouth 3 (three) times daily as needed for cough.  Dispense: 30 capsule; Refill: 0  2. Mild intermittent asthma without complication  - albuterol (VENTOLIN HFA) 108 (90 Base) MCG/ACT inhaler; Inhale 1-2 puffs into the lungs every 6 (six) hours as needed for wheezing or shortness of breath (cough).  Dispense: 8 g; Refill: 0 - benzonatate (TESSALON) 100 MG capsule; Take 1 capsule (100 mg total) by mouth 3 (three) times daily as needed for cough.  Dispense: 30 capsule; Refill: 0  -most likely flu- outside antiviral window -with asthma- no current red flags- strict in person precautions reviewed  - Continue OTC symptomatic management of choice  - Take prescribed medications as directed - Push fluids - Rest as needed - Discussed return precautions and when to seek in-person evaluation, sent via AVS as well  Reviewed side effects, risks and benefits of medication.    Patient acknowledged agreement and understanding of the plan.   Past Medical, Surgical, Social  History, Allergies, and Medications have been Reviewed.    Follow Up Instructions: I discussed the assessment and treatment plan with the patient. The patient was provided an opportunity to ask questions and all were answered. The patient agreed with the plan and demonstrated an understanding of the instructions.  A copy of instructions were sent to the patient via MyChart unless otherwise noted below.    The patient was advised to call back or seek an in-person evaluation if the symptoms worsen or if the condition fails to improve as anticipated.    Freddy Finner, NP

## 2023-04-08 NOTE — Patient Instructions (Addendum)
 Kristina Bonilla, thank you for joining Freddy Finner, NP for today's virtual visit.  While this provider is not your primary care provider (PCP), if your PCP is located in our provider database this encounter information will be shared with them immediately following your visit.   A [facility] MyChart account gives you access to today's visit and all your visits, tests, and labs performed at [facility] " click here if you don't have a [facility] MyChart account or go to [email or web address]  Consent: (Patient) Kristina Bonilla provided verbal consent for this virtual visit at the beginning of the encounter.  Current Medications:  Current Outpatient Medications:    albuterol (VENTOLIN HFA) 108 (90 Base) MCG/ACT inhaler, Inhale 1-2 puffs into the lungs every 6 (six) hours as needed for wheezing or shortness of breath (cough)., Disp: 8 g, Rfl: 0   benzonatate (TESSALON) 100 MG capsule, Take 1 capsule (100 mg total) by mouth 3 (three) times daily as needed for cough., Disp: 30 capsule, Rfl: 0   promethazine-dextromethorphan (PROMETHAZINE-DM) 6.25-15 MG/5ML syrup, Take 5 mLs by mouth 4 (four) times daily as needed for cough., Disp: 118 mL, Rfl: 0   amLODipine (NORVASC) 10 MG tablet, Take 1 tablet (10 mg total) by mouth daily., Disp: 90 tablet, Rfl: 3   cetirizine (ZYRTEC) 10 MG tablet, TAKE 1 TABLET (10 MG TOTAL) BY MOUTH AT BEDTIME., Disp: 30 tablet, Rfl: 5   Cholecalciferol (VITAMIN D3) 125 MCG (5000 UT) TABS, Take 2 tablets (10,000 Units total) by mouth daily at 12 noon., Disp: 30 tablet, Rfl: 2   cyclobenzaprine (FLEXERIL) 5 MG tablet, Take 1 tablet (5 mg total) by mouth 2 (two) times daily as needed for muscle spasms., Disp: 30 tablet, Rfl: 1   EPINEPHrine 0.3 mg/0.3 mL IJ SOAJ injection, Inject 0.3 mg into the muscle as needed., Disp: 1 each, Rfl: 2   escitalopram (LEXAPRO) 20 MG tablet, Take 1 tablet (20 mg total) by mouth daily., Disp: 30 tablet, Rfl: 2   famotidine (PEPCID)  20 MG tablet, Take 1 tablet (20 mg total) by mouth at bedtime., Disp: 90 tablet, Rfl: 3   lamoTRIgine (LAMICTAL) 25 MG tablet, Take 2 tablets (50 mg total) by mouth daily., Disp: 60 tablet, Rfl: 2   losartan-hydrochlorothiazide (HYZAAR) 100-25 MG tablet, Take 1 tablet by mouth daily., Disp: 90 tablet, Rfl: 3   meloxicam (MOBIC) 7.5 MG tablet, Take 1 tablet (7.5 mg total) by mouth daily as needed for pain., Disp: 30 tablet, Rfl: 1   Naltrexone-buPROPion HCl ER (CONTRAVE) 8-90 MG TB12, Start 1 tablet every morning for 7 days, then 1 tablet twice daily for 7 days, then 2 tablets every morning and one every evening for 7 days, then 2 tablets every morning and every evening., Disp: 120 tablet, Rfl: 0   ondansetron (ZOFRAN) 4 MG tablet, Take 1 tablet (4 mg total) by mouth every 8 (eight) hours as needed for nausea or vomiting., Disp: 20 tablet, Rfl: 0   propranolol (INDERAL) 10 MG tablet, Take 1 tablet (10 mg total) by mouth 2 (two) times daily., Disp: 180 tablet, Rfl: 0   Medications ordered in this encounter:  Meds ordered this encounter  Medications   albuterol (VENTOLIN HFA) 108 (90 Base) MCG/ACT inhaler    Sig: Inhale 1-2 puffs into the lungs every 6 (six) hours as needed for wheezing or shortness of breath (cough).    Dispense:  8 g    Refill:  0    Supervising Provider:  LAMPTEY, PHILIP O [[id number]]   promethazine-dextromethorphan (PROMETHAZINE-DM) 6.25-15 MG/5ML syrup    Sig: Take 5 mLs by mouth 4 (four) times daily as needed for cough.    Dispense:  118 mL    Refill:  0    Supervising Provider:   Merrilee Jansky [[id number]]   benzonatate (TESSALON) 100 MG capsule    Sig: Take 1 capsule (100 mg total) by mouth 3 (three) times daily as needed for cough.    Dispense:  30 capsule    Refill:  0    Supervising Provider:   Merrilee Jansky [[id number]]     *If you need refills on other medications prior to your next appointment, please contact your pharmacy*  Follow-Up: Call back or seek an  in-person evaluation if the symptoms worsen or if the condition fails to improve as anticipated.  [facility] [phone]  Other Instructions - Continue OTC symptomatic management of choice  - Take prescribed medications as directed - Push fluids - Rest as needed   If you have been instructed to have an in-person evaluation today at a local Urgent Care facility, please use the link below. It will take you to a list of all of our available [facility], including address, phone number and hours of operation. Please do not delay care.  [facility]  If you or a family member do not have a primary care provider, use the link below to schedule a visit and establish care. When you choose a [facility] primary care physician or advanced practice provider, you gain a long-term partner in health. Find a Primary Care Provider  Learn more about [facility]'s in-office and virtual care options: [facility] - Get Care Now

## 2023-04-19 ENCOUNTER — Other Ambulatory Visit: Payer: Self-pay | Admitting: Internal Medicine

## 2023-05-07 ENCOUNTER — Other Ambulatory Visit (HOSPITAL_COMMUNITY): Payer: Self-pay

## 2023-05-09 ENCOUNTER — Ambulatory Visit: Admitting: Family Medicine

## 2023-06-05 ENCOUNTER — Ambulatory Visit: Payer: Commercial Managed Care - PPO | Admitting: Internal Medicine

## 2023-06-05 ENCOUNTER — Encounter: Payer: Self-pay | Admitting: Internal Medicine

## 2023-06-05 DIAGNOSIS — K219 Gastro-esophageal reflux disease without esophagitis: Secondary | ICD-10-CM | POA: Diagnosis not present

## 2023-06-05 DIAGNOSIS — F331 Major depressive disorder, recurrent, moderate: Secondary | ICD-10-CM

## 2023-06-05 DIAGNOSIS — I10 Essential (primary) hypertension: Secondary | ICD-10-CM

## 2023-06-05 MED ORDER — TIRZEPATIDE-WEIGHT MANAGEMENT 2.5 MG/0.5ML ~~LOC~~ SOLN: 2.5000 mg | SUBCUTANEOUS | 0 refills | Status: DC

## 2023-06-05 NOTE — Assessment & Plan Note (Signed)
Well controlled with Pepcid 20 mg daily

## 2023-06-05 NOTE — Assessment & Plan Note (Signed)
 

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
## 2023-06-05 NOTE — Assessment & Plan Note (Signed)
 Well controlled with Lexapro  20 mg QD and Lamictal  50 mg QD Followed by psychiatry

## 2023-06-05 NOTE — Patient Instructions (Signed)
 Please start taking Zepbound as prescribed.  Please continue to take medications as prescribed.  Please continue to follow low carb diet and perform moderate exercise/walking at least 150 mins/week.

## 2023-06-05 NOTE — Assessment & Plan Note (Signed)
 BMI Readings from Last 3 Encounters:  06/05/23 42.36 kg/m  03/07/23 40.81 kg/m  10/04/22 39.75 kg/m   Has been trying to follow low-carb diet and performs exercise/walking as tolerated for the last 1 year  - has cut down red meat and bread intake Was on metformin  for prediabetes in the past Was on Wegovy  - initial BMI 44.52, had been losing weight, but had to stop due to loss of coverage She is motivated to follow low-carb diet and perform moderate exercise/walking Qsymia  was not covered Tried phentermine  and later Contrave , discontinued it due to lack of efficacy now  Started Zepbound through [employer] direct program, initial BMI: 42.36, plan to increase dose as tolerated

## 2023-06-12 ENCOUNTER — Encounter (HOSPITAL_COMMUNITY): Payer: Self-pay | Admitting: Psychiatry

## 2023-06-12 ENCOUNTER — Telehealth (HOSPITAL_COMMUNITY): Admitting: Psychiatry

## 2023-06-12 DIAGNOSIS — F5102 Adjustment insomnia: Secondary | ICD-10-CM

## 2023-06-12 DIAGNOSIS — F331 Major depressive disorder, recurrent, moderate: Secondary | ICD-10-CM

## 2023-06-12 DIAGNOSIS — F4310 Post-traumatic stress disorder, unspecified: Secondary | ICD-10-CM

## 2023-06-12 MED ORDER — LAMOTRIGINE 25 MG PO TABS: 75.0000 mg | ORAL_TABLET | Freq: Every day | ORAL | 0 refills | Status: DC

## 2023-06-12 MED ORDER — ESCITALOPRAM OXALATE 20 MG PO TABS
20.0000 mg | ORAL_TABLET | Freq: Every day | ORAL | 2 refills | Status: DC
  Filled 2023-08-15: qty 30, 30d supply, fill #0
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
## 2023-06-13 DIAGNOSIS — Z0289 Encounter for other administrative examinations: Secondary | ICD-10-CM

## 2023-06-24 ENCOUNTER — Other Ambulatory Visit: Payer: Self-pay | Admitting: Internal Medicine

## 2023-06-24 MED ORDER — TIRZEPATIDE-WEIGHT MANAGEMENT 10 MG/0.5ML ~~LOC~~ SOLN: 10.0000 mg | SUBCUTANEOUS | 1 refills | Status: DC

## 2023-07-29 ENCOUNTER — Other Ambulatory Visit: Payer: Self-pay

## 2023-07-29 ENCOUNTER — Other Ambulatory Visit: Payer: Self-pay | Admitting: Internal Medicine

## 2023-07-29 ENCOUNTER — Other Ambulatory Visit (HOSPITAL_COMMUNITY): Payer: Self-pay

## 2023-07-29 ENCOUNTER — Other Ambulatory Visit (HOSPITAL_COMMUNITY): Payer: Self-pay | Admitting: Psychiatry

## 2023-07-29 DIAGNOSIS — I10 Essential (primary) hypertension: Secondary | ICD-10-CM

## 2023-07-29 MED ORDER — ESCITALOPRAM OXALATE 20 MG PO TABS
20.0000 mg | ORAL_TABLET | Freq: Every day | ORAL | 1 refills | Status: DC
  Filled 2023-07-29: qty 30, 30d supply, fill #0

## 2023-07-29 MED ORDER — LOSARTAN POTASSIUM-HCTZ 100-25 MG PO TABS
1.0000 | ORAL_TABLET | Freq: Every day | ORAL | 3 refills | Status: AC
Start: 2023-07-29 — End: ?
  Filled 2023-07-29: qty 90, 90d supply, fill #0
  Filled 2023-10-16: qty 90, 90d supply, fill #1

## 2023-07-29 MED ORDER — AMLODIPINE BESYLATE 10 MG PO TABS
10.0000 mg | ORAL_TABLET | Freq: Every day | ORAL | 3 refills | Status: AC
Start: 2023-07-29 — End: ?
  Filled 2023-07-29: qty 90, 90d supply, fill #0
  Filled 2023-10-16: qty 90, 90d supply, fill #1

## 2023-07-30 ENCOUNTER — Other Ambulatory Visit (HOSPITAL_COMMUNITY): Payer: Self-pay

## 2023-08-12 ENCOUNTER — Ambulatory Visit: Admitting: Internal Medicine

## 2023-08-14 ENCOUNTER — Telehealth (HOSPITAL_COMMUNITY): Admitting: Psychiatry

## 2023-08-14 ENCOUNTER — Encounter (HOSPITAL_COMMUNITY): Payer: Self-pay | Admitting: Psychiatry

## 2023-08-14 ENCOUNTER — Other Ambulatory Visit (HOSPITAL_BASED_OUTPATIENT_CLINIC_OR_DEPARTMENT_OTHER): Payer: Self-pay

## 2023-08-14 DIAGNOSIS — F4310 Post-traumatic stress disorder, unspecified: Secondary | ICD-10-CM

## 2023-08-14 DIAGNOSIS — F331 Major depressive disorder, recurrent, moderate: Secondary | ICD-10-CM

## 2023-08-14 DIAGNOSIS — F5102 Adjustment insomnia: Secondary | ICD-10-CM

## 2023-08-14 MED ORDER — LAMOTRIGINE 25 MG PO TABS: 75.0000 mg | ORAL_TABLET | Freq: Every day | ORAL | 1 refills | Status: DC

## 2023-08-14 MED ORDER — ESCITALOPRAM OXALATE 20 MG PO TABS: 20.0000 mg | ORAL_TABLET | Freq: Every day | ORAL | 1 refills | Status: DC
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
## 2023-08-15 ENCOUNTER — Other Ambulatory Visit (HOSPITAL_COMMUNITY): Payer: Self-pay

## 2023-08-20 ENCOUNTER — Other Ambulatory Visit (HOSPITAL_COMMUNITY): Payer: Self-pay

## 2023-08-21 ENCOUNTER — Encounter: Payer: Self-pay | Admitting: Internal Medicine

## 2023-08-22 ENCOUNTER — Ambulatory Visit: Payer: Self-pay

## 2023-08-22 NOTE — Telephone Encounter (Signed)
 FYI Only or Action Required?: FYI only for provider.  Patient was last seen in primary care on 06/05/2023 by Tobie Suzzane POUR, MD.  Called Nurse Triage reporting Hand Pain.  Symptoms began about a month ago.  Symptoms are: gradually worsening.  Triage Disposition: See HCP Within 4 Hours (Or PCP Triage)  Patient/caregiver understands and will follow disposition?: No     Copied from CRM [id number]. Topic: Clinical - Red Word Triage >> Aug 22, 2023 10:55 AM Selinda RAMAN wrote: Red Word that prompted transfer to Nurse Triage: The patient called in stating her left right finger and palm of her hand to her wrist has been really swollen and painful. She says it has been about a month that it has bothered her. She has used heat and ice for very temporary relief but it just comes back. I will transfer her to E2C2 NT    Reason for Disposition  [1] SEVERE pain (e.g., excruciating) AND [2] not improved after 2 hours of pain medicine  Answer Assessment - Initial Assessment Questions 1. ONSET: When did the pain start?      1 month ago  2. LOCATION and RADIATION: Where is the pain located?  (e.g., fingertip, around nail, joint, entire      Left ring finger  3. SEVERITY: How bad is the pain? What does it keep you from doing?   (Scale 1-10; or mild, moderate, severe)     10/10 with movement  4. APPEARANCE: What does the finger look like? (e.g., redness, swelling, bruising, pallor)     Swelling present  5. WORK OR EXERCISE: Has there been any recent work or exercise that involved this part (i.e., fingers or hand) of the body?     No 6. CAUSE: What do you think is causing the pain?     Unsure 7. AGGRAVATING FACTORS: What makes the pain worse? (e.g., using computer)     Moving finger exacerbates pain 8. OTHER SYMPTOMS: Do you have any other symptoms? (e.g., fever, neck pain, numbness)     Pain extends to palm of hand and wrist  Protocols used: Finger Pain-A-AH

## 2023-08-27 ENCOUNTER — Other Ambulatory Visit: Payer: Self-pay | Admitting: Internal Medicine

## 2023-09-11 ENCOUNTER — Ambulatory Visit: Payer: Self-pay | Admitting: Internal Medicine

## 2023-09-11 ENCOUNTER — Encounter: Payer: Self-pay | Admitting: Internal Medicine

## 2023-09-11 VITALS — BP 138/88 | HR 92 | Ht 61.0 in | Wt 200.4 lb

## 2023-09-11 DIAGNOSIS — E559 Vitamin D deficiency, unspecified: Secondary | ICD-10-CM

## 2023-09-11 DIAGNOSIS — M720 Palmar fascial fibromatosis [Dupuytren]: Secondary | ICD-10-CM | POA: Insufficient documentation

## 2023-09-11 DIAGNOSIS — Z0001 Encounter for general adult medical examination with abnormal findings: Secondary | ICD-10-CM

## 2023-09-11 DIAGNOSIS — Z23 Encounter for immunization: Secondary | ICD-10-CM

## 2023-09-11 DIAGNOSIS — E0590 Thyrotoxicosis, unspecified without thyrotoxic crisis or storm: Secondary | ICD-10-CM

## 2023-09-11 DIAGNOSIS — E782 Mixed hyperlipidemia: Secondary | ICD-10-CM | POA: Diagnosis not present

## 2023-09-11 DIAGNOSIS — R7303 Prediabetes: Secondary | ICD-10-CM

## 2023-09-11 DIAGNOSIS — I10 Essential (primary) hypertension: Secondary | ICD-10-CM | POA: Diagnosis not present

## 2023-09-11 NOTE — Assessment & Plan Note (Signed)
Physical exam as documented. Fasting blood tests ordered. 

## 2023-09-11 NOTE — Assessment & Plan Note (Addendum)
 Lab Results  Component Value Date   HGBA1C 5.5 07/26/2022   Continue to follow low-carb diet for now On Zepbound  for weight loss

## 2023-09-11 NOTE — Assessment & Plan Note (Signed)
 Lab Results  Component Value Date   TSH 0.200 (L) 07/26/2022   Followed by Endocrinology - lost follow up, has follow-up appointment now However symptoms do not align with hyperthyroidism, she actually had fatigue, weight gain and constipation Check TSH and free T4

## 2023-09-11 NOTE — Patient Instructions (Signed)
Please continue to take medications as prescribed.  Please continue to follow low carb diet and perform moderate exercise/walking at least 150 mins/week.  Please get fasting blood tests done within a week.  

## 2023-09-11 NOTE — Assessment & Plan Note (Signed)
 Inability to flex left ring finger completely, likely due to contracture versus trigger finger Referred to hand surgeon

## 2023-09-11 NOTE — Assessment & Plan Note (Signed)
 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

## 2023-09-11 NOTE — Assessment & Plan Note (Addendum)
 BMI Readings from Last 3 Encounters:  09/11/23 37.87 kg/m  06/05/23 42.36 kg/m  03/07/23 40.81 kg/m   On Zepbound  through [employer] direct program, initial BMI: 42.36, plan to increase dose as tolerated Has been trying to follow low-carb diet and performs exercise/walking as tolerated for the last 1 year  - has cut down red meat and bread intake Was on metformin  for prediabetes in the past Was on Wegovy  - initial BMI 44.52, had been losing weight, but had to stop due to loss of coverage She is motivated to follow low-carb diet and perform moderate exercise/walking Qsymia  was not covered Tried phentermine  and later Contrave , discontinued it due to lack of efficacy
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
## 2023-09-17 ENCOUNTER — Other Ambulatory Visit: Payer: Self-pay | Admitting: Internal Medicine

## 2023-09-17 ENCOUNTER — Ambulatory Visit: Payer: Self-pay

## 2023-09-17 DIAGNOSIS — M5030 Other cervical disc degeneration, unspecified cervical region: Secondary | ICD-10-CM

## 2023-09-17 MED ORDER — MELOXICAM 15 MG PO TABS: 15.0000 mg | ORAL_TABLET | Freq: Every day | ORAL | 1 refills | Status: AC | PRN

## 2023-09-17 NOTE — Telephone Encounter (Signed)
  FYI Only or Action Required?: Action required by provider: update on patient condition.  Patient was last seen in primary care on 09/11/2023 by Tobie Suzzane POUR, MD.  Called Nurse Triage reporting Hand Pain.  Symptoms began several weeks ago.  Interventions attempted: OTC medications: not helping. Asking for pain medicine.  Symptoms are: gradually worsening. Pt. Asking about referral, has not heard from anyone.  Triage Disposition: See PCP Within 2 Weeks  Patient/caregiver understands and will follow disposition?: Yes  Copied from CRM [id number]. Topic: Clinical - Red Word Triage >> Sep 17, 2023  2:26 PM [name]: Red Word that prompted transfer to Nurse Triage: Left ring finger, swelling and cannot make fist. Hurts really bad Reason for Disposition  Finger pain is a chronic symptom (recurrent or ongoing AND present > 4 weeks)  Answer Assessment - Initial Assessment Questions 1. ONSET: When did the pain start?      3 months 2. LOCATION and RADIATION: Where is the pain located?  (e.g., fingertip, around nail, joint, entire      Left ringer and palm 3. SEVERITY: How bad is the pain? What does it keep you from doing?   (Scale 1-10; or mild, moderate, severe)     9 4. APPEARANCE: What does the finger look like? (e.g., redness, swelling, bruising, pallor)     swelling 5. WORK OR EXERCISE: Has there been any recent work or exercise that involved this part (i.e., fingers or hand) of the body?     no 6. CAUSE: What do you think is causing the pain?     unsure 7. AGGRAVATING FACTORS: What makes the pain worse? (e.g., using computer)     using 8. OTHER SYMPTOMS: Do you have any other symptoms? (e.g., fever, neck pain, numbness)     no 9. PREGNANCY: Is there any chance you are pregnant? When was your last menstrual period?     no  Protocols used: Finger Pain-A-AH

## 2023-09-23 ENCOUNTER — Encounter (HOSPITAL_COMMUNITY): Payer: Self-pay | Admitting: Psychiatry

## 2023-09-23 ENCOUNTER — Telehealth (INDEPENDENT_AMBULATORY_CARE_PROVIDER_SITE_OTHER): Payer: Commercial Managed Care - PPO | Admitting: Psychiatry

## 2023-09-23 DIAGNOSIS — F331 Major depressive disorder, recurrent, moderate: Secondary | ICD-10-CM | POA: Diagnosis not present

## 2023-09-23 DIAGNOSIS — F5102 Adjustment insomnia: Secondary | ICD-10-CM | POA: Diagnosis not present

## 2023-09-23 DIAGNOSIS — F4310 Post-traumatic stress disorder, unspecified: Secondary | ICD-10-CM | POA: Diagnosis not present

## 2023-09-23 MED ORDER — LAMOTRIGINE 25 MG PO TABS: 75.0000 mg | ORAL_TABLET | Freq: Every day | ORAL | 1 refills | Status: DC

## 2023-09-23 MED ORDER — ESCITALOPRAM OXALATE 20 MG PO TABS: 20.0000 mg | ORAL_TABLET | Freq: Every day | ORAL | 2 refills | Status: AC
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
## 2023-10-16 ENCOUNTER — Other Ambulatory Visit: Payer: Self-pay

## 2023-10-16 ENCOUNTER — Other Ambulatory Visit (HOSPITAL_COMMUNITY): Payer: Self-pay | Admitting: Psychiatry

## 2023-10-16 ENCOUNTER — Other Ambulatory Visit (HOSPITAL_COMMUNITY): Payer: Self-pay

## 2023-10-16 MED ORDER — LAMOTRIGINE 25 MG PO TABS
50.0000 mg | ORAL_TABLET | Freq: Every day | ORAL | 2 refills | Status: DC
  Filled 2023-10-16: qty 60, 30d supply, fill #0
  Filled 2024-01-10 (×2): qty 60, 30d supply, fill #1

## 2023-10-16 MED ORDER — ESCITALOPRAM OXALATE 20 MG PO TABS
20.0000 mg | ORAL_TABLET | Freq: Every day | ORAL | 1 refills | Status: AC
  Filled 2023-10-16: qty 30, 30d supply, fill #0
  Filled 2024-01-10 (×2): qty 30, 30d supply, fill #1

## 2023-11-06 ENCOUNTER — Other Ambulatory Visit: Payer: Self-pay

## 2023-11-06 ENCOUNTER — Telehealth: Payer: Self-pay

## 2023-11-06 ENCOUNTER — Encounter (HOSPITAL_COMMUNITY): Payer: Self-pay | Admitting: Internal Medicine

## 2023-11-06 DIAGNOSIS — Z1231 Encounter for screening mammogram for malignant neoplasm of breast: Secondary | ICD-10-CM

## 2023-11-06 NOTE — Telephone Encounter (Signed)
 Copied from CRM [id number]. Topic: General - Other >> Nov 06, 2023  4:00 PM Santiya F wrote: Reason for CRM: Patient is calling in because she is requesting an order be faxed over to radiology for a breast diagnostics. She is also requesting an update on her referral for her hand.

## 2023-11-06 NOTE — Telephone Encounter (Signed)
 Attempted to contact pt to let her know mammogram order has been placed, looking at her referral it looks like it has been closed , pt was to contact [facility] for scheduling.

## 2023-11-07 NOTE — Telephone Encounter (Signed)
SECOND ATTEMPT

## 2023-11-07 NOTE — Telephone Encounter (Signed)
Multiple attempts

## 2023-11-20 ENCOUNTER — Telehealth (HOSPITAL_COMMUNITY): Admitting: Psychiatry

## 2023-11-25 ENCOUNTER — Ambulatory Visit: Admitting: Orthopedic Surgery

## 2023-11-25 DIAGNOSIS — M65342 Trigger finger, left ring finger: Secondary | ICD-10-CM | POA: Diagnosis not present

## 2023-11-25 MED ORDER — BETAMETHASONE SOD PHOS & ACET 6 (3-3) MG/ML IJ SUSP
6.0000 mg | INTRAMUSCULAR | Status: AC | PRN
  Administered 2023-11-25: 6 mg via INTRA_ARTICULAR

## 2023-11-25 MED ORDER — LIDOCAINE HCL 1 % IJ SOLN
1.0000 mL | INTRAMUSCULAR | Status: AC | PRN
  Administered 2023-11-25: 1 mL
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
## 2023-12-09 ENCOUNTER — Encounter: Payer: Self-pay | Admitting: Radiology

## 2023-12-23 ENCOUNTER — Telehealth (HOSPITAL_COMMUNITY): Admitting: Psychiatry

## 2023-12-23 ENCOUNTER — Encounter (HOSPITAL_COMMUNITY): Payer: Self-pay

## 2023-12-23 ENCOUNTER — Ambulatory Visit (HOSPITAL_COMMUNITY)

## 2024-01-05 NOTE — Progress Notes (Deleted)
 Left hand ringer finger trigger digit follow up Injection 11/25/23

## 2024-01-06 ENCOUNTER — Ambulatory Visit: Admitting: Orthopedic Surgery

## 2024-01-10 ENCOUNTER — Other Ambulatory Visit: Payer: Self-pay

## 2024-01-10 ENCOUNTER — Other Ambulatory Visit (HOSPITAL_COMMUNITY): Payer: Self-pay

## 2024-01-13 ENCOUNTER — Encounter (HOSPITAL_COMMUNITY): Payer: Self-pay | Admitting: Psychiatry

## 2024-01-13 ENCOUNTER — Telehealth (HOSPITAL_COMMUNITY): Admitting: Psychiatry

## 2024-01-13 DIAGNOSIS — F331 Major depressive disorder, recurrent, moderate: Secondary | ICD-10-CM

## 2024-01-13 DIAGNOSIS — F5102 Adjustment insomnia: Secondary | ICD-10-CM

## 2024-01-13 DIAGNOSIS — F4310 Post-traumatic stress disorder, unspecified: Secondary | ICD-10-CM

## 2024-01-13 MED ORDER — LAMOTRIGINE 100 MG PO TABS: 100.0000 mg | ORAL_TABLET | Freq: Every day | ORAL | Status: AC
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
## 2024-01-23 ENCOUNTER — Ambulatory Visit (HOSPITAL_COMMUNITY): Admission: RE | Admit: 2024-01-23 | Source: Ambulatory Visit

## 2024-01-23 DIAGNOSIS — Z1231 Encounter for screening mammogram for malignant neoplasm of breast: Secondary | ICD-10-CM | POA: Insufficient documentation

## 2024-02-16 NOTE — Progress Notes (Unsigned)
 Follow up left ring finger trigger digit

## 2024-02-17 ENCOUNTER — Ambulatory Visit: Admitting: Orthopedic Surgery

## 2024-03-16 ENCOUNTER — Telehealth (HOSPITAL_COMMUNITY): Admitting: Psychiatry
